# Patient Record
Sex: Male | Born: 1940 | Race: White | Hispanic: No | Smoking: Former smoker
Health system: Southern US, Community
[De-identification: ages and names within clinical notes are randomized; demographics above are authoritative.]

## PROBLEM LIST (undated history)

## (undated) DIAGNOSIS — R1110 Vomiting, unspecified: Secondary | ICD-10-CM

## (undated) DIAGNOSIS — E785 Hyperlipidemia, unspecified: Secondary | ICD-10-CM

## (undated) DIAGNOSIS — I10 Essential (primary) hypertension: Secondary | ICD-10-CM

## (undated) DIAGNOSIS — I739 Peripheral vascular disease, unspecified: Secondary | ICD-10-CM

## (undated) DIAGNOSIS — J449 Chronic obstructive pulmonary disease, unspecified: Secondary | ICD-10-CM

## (undated) HISTORY — PX: TONSILLECTOMY: SUR1361

---

## 2006-07-10 ENCOUNTER — Emergency Department: Payer: Self-pay | Admitting: Emergency Medicine

## 2007-02-27 ENCOUNTER — Ambulatory Visit: Payer: Self-pay | Admitting: Gastroenterology

## 2007-12-27 ENCOUNTER — Ambulatory Visit: Payer: Self-pay | Admitting: Family Medicine

## 2009-01-17 ENCOUNTER — Emergency Department: Payer: Self-pay | Admitting: Emergency Medicine

## 2009-07-10 ENCOUNTER — Emergency Department: Payer: Self-pay | Admitting: Emergency Medicine

## 2010-10-02 ENCOUNTER — Emergency Department: Payer: Self-pay | Admitting: Unknown Physician Specialty

## 2012-07-17 ENCOUNTER — Emergency Department: Payer: Self-pay | Admitting: Emergency Medicine

## 2013-01-21 ENCOUNTER — Ambulatory Visit: Payer: Self-pay | Admitting: Vascular Surgery

## 2013-01-21 LAB — CREATININE, SERUM: EGFR (African American): >60

## 2013-01-21 LAB — BUN: BUN: 16 mg/dL (ref 7–18)

## 2013-02-14 ENCOUNTER — Emergency Department: Payer: Self-pay | Admitting: Emergency Medicine

## 2013-03-13 ENCOUNTER — Ambulatory Visit: Payer: Self-pay | Admitting: Vascular Surgery

## 2013-03-13 LAB — CBC
HCT: 44.9 % (ref 40.0–52.0)
HGB: 15.4 g/dL (ref 13.0–18.0)
MCH: 34.5 pg — ABNORMAL HIGH (ref 26.0–34.0)
MCV: 101 fL — ABNORMAL HIGH (ref 80–100)
RBC: 4.45 10*6/uL (ref 4.40–5.90)
RDW: 13.1 % (ref 11.5–14.5)

## 2013-03-13 LAB — BASIC METABOLIC PANEL
BUN: 15 mg/dL (ref 7–18)
Calcium, Total: 8.9 mg/dL (ref 8.5–10.1)
EGFR (African American): >60
Glucose: 156 mg/dL — ABNORMAL HIGH (ref 65–99)

## 2013-03-21 ENCOUNTER — Inpatient Hospital Stay: Payer: Self-pay | Admitting: Vascular Surgery

## 2013-03-22 LAB — BASIC METABOLIC PANEL
Anion Gap: 3 — ABNORMAL LOW (ref 7–16)
BUN: 15 mg/dL (ref 7–18)
Calcium, Total: 8.4 mg/dL — ABNORMAL LOW (ref 8.5–10.1)
Chloride: 102 mmol/L (ref 98–107)
Co2: 32 mmol/L (ref 21–32)
Creatinine: 1 mg/dL (ref 0.60–1.30)
EGFR (Non-African Amer.): >60
Glucose: 99 mg/dL (ref 65–99)
Osmolality: 275 (ref 275–301)
Potassium: 3.8 mmol/L (ref 3.5–5.1)
Sodium: 137 mmol/L (ref 136–145)

## 2013-03-22 LAB — CBC WITH DIFFERENTIAL/PLATELET
Basophil #: 0.1 10*3/uL (ref 0.0–0.1)
Basophil %: 0.4 %
Eosinophil %: 3 %
HCT: 41.2 % (ref 40.0–52.0)
Lymphocyte #: 1.6 10*3/uL (ref 1.0–3.6)
MCHC: 33.9 g/dL (ref 32.0–36.0)
Monocyte #: 1.6 x10 3/mm — ABNORMAL HIGH (ref 0.2–1.0)
Monocyte %: 12.4 %
Neutrophil #: 9.4 10*3/uL — ABNORMAL HIGH (ref 1.4–6.5)
Neutrophil %: 71.7 %
Platelet: 299 10*3/uL (ref 150–440)
RBC: 4.06 10*6/uL — ABNORMAL LOW (ref 4.40–5.90)
RDW: 12.9 % (ref 11.5–14.5)
WBC: 13.1 10*3/uL — ABNORMAL HIGH (ref 3.8–10.6)

## 2014-06-17 DIAGNOSIS — I38 Endocarditis, valve unspecified: Secondary | ICD-10-CM | POA: Insufficient documentation

## 2014-06-17 DIAGNOSIS — E782 Mixed hyperlipidemia: Secondary | ICD-10-CM | POA: Insufficient documentation

## 2014-06-17 DIAGNOSIS — I4892 Unspecified atrial flutter: Secondary | ICD-10-CM | POA: Insufficient documentation

## 2015-04-09 NOTE — Op Note (Signed)
PATIENT NAME:  [name], [name] MR#:  [id number] DATE OF BIRTH:  1941/04/17  DATE OF PROCEDURE:  03/21/2013  PREOPERATIVE DIAGNOSIS: Atherosclerotic occlusive disease, bilateral lower extremities, with rest pain of the right foot.   POSTOPERATIVE DIAGNOSIS: Atherosclerotic occlusive disease, bilateral lower extremities, with rest pain of the right foot.   PROCEDURES PERFORMED:  1. Profunda femoris and common femoral endarterectomy with repair of arterial defect using xenograft CorMatrix patch.  2. Superficial femoral artery endarterectomy with repair of arterial defect using CorMatrix xenograft patch.   PROCEDURE PERFORMED BY: Katha Cabal, MD  FIRST ASSISTANT: Marin Shutter. Hanne, PA-C  ANESTHESIA: General by LMA.   FLUIDS: Per anesthesia record.   ESTIMATED BLOOD LOSS: 100 mL.   SPECIMEN: Plaque from the common femoral and profunda femoris as well as plaque from the superficial femoral artery to pathology for permanent section.   INDICATIONS: The patient is a 74 year old gentleman who presented to the office with increasing pain and worsening of his arterial occlusive disease on the right leg especially. He subsequently underwent angiography, which included intervention at the common iliac artery level. He did well from this, but has not had complete relief. At the time of angiography, it was noted that the patient had complete occlusion of the common femoral with significant heavy disease extending into the profunda femoris as well as the superficial femoral.   The risks and benefits of open endarterectomy with repair were reviewed. All questions answered. The patient has agreed to proceed with surgery.   DESCRIPTION OF PROCEDURE: The patient is taken to the operating room and placed in the supine position. After adequate general anesthesia is induced and appropriate invasive monitors are placed, he is positioned supine, and his right groin is prepped and draped in a sterile fashion.    A vertical incision is then created, and the dissection is carried down through the soft tissues to expose the common femoral artery. Dissection is then carried distally, exposing the femoral bifurcation. Superficial femoral artery is dissected for a distance of approximately 5 cm. The profunda femoris artery is dissected for a distance of approximately 2 to 3 cm, extending down to the third order branches and looping each individual profunda branch with Silastic vessel loops. The common femoral is then exposed more proximally, dissecting the external iliac artery circumferentially just above the circumflex branches. Each circumflex lateral and medial was also looped with a Silastic vessel loop. Heparin 5000 units was given. It should be noted that the vein crossing the external iliac artery just above the ligament was ligated with 2-0 silk ties. Also, the profunda vein crossing between the superficial femoral artery and the profunda femoris artery was ligated with 2-0 silk ties. Various other small collateral branches were ligated with 2-0 or 3-0 silk as they were encountered.   After 4 minutes of heparinization, the external iliac, followed by the profunda branches and the SFA were all occluded. Arteriotomy was made in the common femoral and extended first proximally and then distally into the profunda femoris, extending past the first and second major divisions. Endarterectomy was then performed under direct visualization from the level of the circumflex branches down into the profunda femoris. Posterior branch was ligated with a 6-0 Prolene. The origin of the superficial femoral artery was treated with the eversion technique at this time.   CorMatrix patch was rehydrated on the back table and then beveled to an appropriate shape and applied to repair the arterial defect using running 6-0 Prolene. Flushing maneuvers were  performed, and flow is re-established down into the profunda branches. Easily  palpable pulse was noted in all 3 major branches distal to the patch.   The superficial femoral artery was then addressed in a separate fashion. It was clamped at the very origin, occluding some of the common femoral and then more distally. Arteriotomy was made with an 11-blade and extended with Potts scissors. Endarterectomy was then performed to the superficial femoral, and a second CorMatrix patch was rehydrated on the back table, beveled and then applied to the superficial femoral artery defect using running 6-0 Prolene. Flushing maneuvers were performed, and flow is established through the superficial femoral artery.   The wound was then inspected for hemostasis. Evicel was placed along the tissue lines, and then the femoral sheath was closed with a running 2-0 Vicryl. Three more layers were reapproximated using running 3-0 Vicryl, and 4-0 Monocryl subcuticular was used to reapproximate the skin. Dermabond was applied. The patient tolerated the procedure well, and there were no immediate complications. Sponge and needle counts were correct, and he was taken to the recovery area in excellent condition.     ____________________________ Katha Cabal, MD ggs:OSi D: 03/21/2013 13:22:10 ET T: 03/21/2013 13:42:54 ET JOB#: [id number]  cc: Katha Cabal, MD, <Dictator> Katha Cabal MD ELECTRONICALLY SIGNED 04/28/2013 13:41

## 2015-04-09 NOTE — Discharge Summary (Signed)
PATIENT NAME:  [name], [name] MR#:  [id number] DATE OF BIRTH:  1941-05-02  DATE OF ADMISSION:  03/21/2013 DATE OF DISCHARGE:  03/22/2013  DISCHARGE DIAGNOSIS:  Atherosclerotic occlusive disease, bilateral lower extremities with rest pain of the left lower extremity.   PROCEDURE PERFORMED:  Right femoral endarterectomy with CorMatrix patch angioplasty.   CONSULTATIONS:  None.   HISTORY OF PRESENT ILLNESS:  The patient presented to the office with worsening rest pain and atherosclerotic changes of his lower extremity.  The risks and benefits for treatment were reviewed.  All questions answered.  The patient agrees to proceed.   HOSPITAL COURSE:  On the day of admission he underwent successful operation.  Postoperative course was unremarkable and on postoperative day #1 he was felt fit for discharge.  He is discharged to home.  He is to follow up with me in 1 week.  His medications are the same with the addition of Percocet.  He will refrain from driving until seen back in the office.   DIET:  Healthy heart as tolerated.     ____________________________ Katha Cabal, MD ggs:ea D: 03/22/2013 14:00:49 ET T: 03/23/2013 03:23:13 ET JOB#: [id number]  cc: Katha Cabal, MD, <Dictator> Katha Cabal MD ELECTRONICALLY SIGNED 04/28/2013 13:41

## 2015-04-09 NOTE — Op Note (Signed)
PATIENT NAME:  [name], [name] MR#:  [id number] DATE OF BIRTH:  [birth year]  DATE OF PROCEDURE:  01/21/2013  PREOPERATIVE DIAGNOSIS: Atherosclerotic occlusive disease of bilateral lower extremities with rest pain of right lower extremity.   POSTOPERATIVE DIAGNOSIS: Atherosclerotic occlusive disease of bilateral lower extremities with rest pain of right lower extremity.   PROCEDURES PERFORMED: 1. Abdominal aortogram.  2. Bilateral lower extremity distal runoff.  3. Introduction catheter into aorta, right common femoral artery.  4. Introduction catheter into aorta, left common femoral artery.  5. Percutaneous transluminal angioplasty and stent placement, left common iliac artery, kissing balloon technique.  6. Percutaneous transluminal angioplasty and stent placement, right common iliac artery to 9 mm, kissing balloon technique.  7. Bilateral arterial closures with ProGlide device.   SURGEON: Hortencia Pilar, MD  SEDATION: Precedex drip with IV fentanyl. Continuous ECG, pulse oximetry and cardiopulmonary monitoring was performed throughout the entire procedure by the interventional radiology nurse. Total sedation time was 1 hour, 20 minutes.   ACCESSES:  1. 6 French sheath, left common femoral artery, in retrograde direction.  2. 6 French sheath, right common femoral artery, retrograde direction.   CONTRAST USED: Isovue 110 mL.   FLUOROSCOPY TIME: 4.5 minutes.   INDICATIONS: Mr. Mcgurn is a 74 year old gentleman who was referred to the office by Dr. Mack Guise for evaluation of right lower extremity pain. Physical examination as well as noninvasive studies demonstrated profound atherosclerotic occlusive disease with rest pain and ischemia. Risks and benefits for angiography and intervention for limb salvage were reviewed, all questions answered, and the patient has agreed to proceed.   DESCRIPTION OF PROCEDURE: The patient is taken to special procedures and placed in the supine position.  After adequate sedation is achieved, both groins are prepped and draped in a sterile fashion. Ultrasound is placed in a sterile sleeve. Ultrasound is utilized secondary to lack of appropriate landmarks and to avoid vascular injury. Under direct ultrasound visualization, the left common femoral artery is identified. The femoral bifurcation is noted and the artery is scanned more proximally. A large eccentric calcified plaque is noted and the scan is carried out slightly more proximally to just above this level where the artery appears to be in relatively good condition, spared of prominent atherosclerotic changes. 1% lidocaine is infiltrated, image is recorded for the permanent record, and under direct ultrasound visualization microneedle is inserted into the anterior wall of the common femoral artery, microwire followed by microsheath, J-wire followed by a 5 French sheath and 5 French pigtail catheter. The pigtail catheter is positioned at the level of T12.   AP projection of the abdominal aorta is then obtained with bolus injection of contrast, pigtail catheter is repositioned to above the bifurcation and bilateral oblique views of the pelvis are obtained. Excessive common iliac artery disease is noted bilaterally. The image intensifier is then repositioned in the AP projection and bilateral lower extremity runoff is obtained down to the ankles with bolus injection of contrast.   After review of these images, the ultrasound is once again utilized on the right side. The  common femoral artery is identified and then scanned to above its occlusion at just the level of the circumflex vessels and because of his long-standing occlusive disease, the medial circumflex is rather large and easily identified on ultrasound. Image is recorded for the permanent record and using a micropuncture needle puncture is made at the level of the circumflex vessels, microwire followed by microsheath, J-wire followed by 6 French  sheath. Magic torque wire  is then exchanged for the J-wire and 4000 units of heparin is given. Magic torque wire is then advanced up the left side and the 5 [place] sheath exchanged for a 6 [place] sheath.   Using the markers on the Magic torque wire to estimate the length of the lesions in the common iliacs and in the RAO projection, magnified image of the aortic bifurcation is made by hand injection. An Omnilink 8 x 39 is selected for the right side and an 8 x 29 is selected for the left side. They are then advanced up and then deployed simultaneously. Deployment was to 10 atmospheres for approximately 20 seconds. Following this a pair of 9 x 4 [place] balloons are advanced up each wire and the stents are then post dilated to 9 mm, again inflations to 10 atmospheres for approximately 20 to 30 seconds.   Pigtail catheter was then introduced up the right side and bolus injection of contrast utilized to demonstrate the stents. Hand injection was used, in a retrograde fashion, up the right and left sheaths to ensure that the external iliacs were also well treated or rather free of hemodynamically significant stenoses. After review of these images, oblique views of the groin are obtained and ProGlide devices are deployed without difficulty. Light pressure is held and there are no immediate complications.   INTERPRETATION: Initial views of the abdominal aorta demonstrate that it is heavily calcified and easily visualized without contrast under fluoroscopy. However, within the aorta itself, there do not appear to be any hemodynamically significant lesions. Within the common iliac arteries bilaterally there are greater than 80% to 90% stenoses, predominantly eccentric plaque formation best visualized in the individual oblique views. The iliac bifurcations appear to be patent bilaterally with fairly good collaterals via the internal. On the left, the external iliac artery is widely patent, the common femoral artery  appears patent, but again there are excessive eccentric calcifications noted. Profunda femoris is patent and extensively collateralizes, at the level of the knee to at knee popliteal, SFA occludes shortly after its origin. The left distal popliteal is patent and there is single vessel runoff to the ankle via the peroneal.   The right common femoral occludes. There is reconstitution of the profunda femoris via collaterals which then fills the SFA. SFA is heavily diseased and at Hunter's canal there is hemodynamically significant eccentric lesions, but it is patent, popliteal artery is patent and there is two-vessel runoff to the ankle via the peroneal which is the dominant runoff and the anterior tibial which appears to be diseased in its distal aspect.   Following angioplasty of the common iliac arteries with placement of Omnilink balloon-expandable stents and then post dilatation of the stents to 9 mm there is wide patency of the iliac system.   SUMMARY: Successful treatment of bilateral common iliac artery occlusive disease.   Given the occlusion of the common femoral, on the right, and his rest pain on the right side this patient will need femoral endarterectomy as this is not an area that is acceptable for intervention         with stent placement and therefore he will undergo cardiac clearance and subsequent operative treatment.  ____________________________ Katha Cabal, MD ggs:sb D: 01/21/2013 10:31:32 ET T: 01/21/2013 12:53:21 ET JOB#: [id number]  cc: Katha Cabal, MD, <Dictator> Katha Cabal MD ELECTRONICALLY SIGNED 02/03/2013 23:25

## 2015-05-08 ENCOUNTER — Encounter: Payer: Self-pay | Admitting: Emergency Medicine

## 2015-05-08 ENCOUNTER — Emergency Department
Admission: EM | Admit: 2015-05-08 | Discharge: 2015-05-08 | Disposition: A | Discharge status: Home / Self Care | Payer: Medicare Other | Attending: Emergency Medicine | Admitting: Emergency Medicine

## 2015-05-08 DIAGNOSIS — Y9289 Other specified places as the place of occurrence of the external cause: Secondary | ICD-10-CM | POA: Diagnosis not present

## 2015-05-08 DIAGNOSIS — Z23 Encounter for immunization: Secondary | ICD-10-CM | POA: Diagnosis not present

## 2015-05-08 DIAGNOSIS — Z880 Allergy status to penicillin: Secondary | ICD-10-CM | POA: Insufficient documentation

## 2015-05-08 DIAGNOSIS — Y9389 Activity, other specified: Secondary | ICD-10-CM | POA: Diagnosis not present

## 2015-05-08 DIAGNOSIS — Z792 Long term (current) use of antibiotics: Secondary | ICD-10-CM | POA: Diagnosis not present

## 2015-05-08 DIAGNOSIS — Y998 Other external cause status: Secondary | ICD-10-CM | POA: Insufficient documentation

## 2015-05-08 DIAGNOSIS — X58XXXA Exposure to other specified factors, initial encounter: Secondary | ICD-10-CM | POA: Insufficient documentation

## 2015-05-08 DIAGNOSIS — S60552A Superficial foreign body of left hand, initial encounter: Secondary | ICD-10-CM | POA: Insufficient documentation

## 2015-05-08 DIAGNOSIS — I10 Essential (primary) hypertension: Secondary | ICD-10-CM | POA: Diagnosis not present

## 2015-05-08 HISTORY — DX: Essential (primary) hypertension: I10

## 2015-05-08 MED ORDER — TETANUS-DIPHTH-ACELL PERTUSSIS 5-2.5-18.5 LF-MCG/0.5 IM SUSP
0.5000 mL | Freq: Once | INTRAMUSCULAR | Status: AC
  Administered 2015-05-08: 0.5 mL via INTRAMUSCULAR

## 2015-05-08 MED ORDER — TETANUS-DIPHTH-ACELL PERTUSSIS 5-2.5-18.5 LF-MCG/0.5 IM SUSP
INTRAMUSCULAR | Status: AC
  Filled 2015-05-08: qty 0.5

## 2015-05-08 MED ORDER — LIDOCAINE HCL (PF) 1 % IJ SOLN
INTRAMUSCULAR | Status: AC
  Filled 2015-05-08: qty 5

## 2015-05-08 MED ORDER — HYDROCODONE-ACETAMINOPHEN 5-325 MG PO TABS: 1.0000 | ORAL_TABLET | ORAL | Status: DC | PRN

## 2015-05-08 MED ORDER — CLINDAMYCIN HCL 150 MG PO CAPS: ORAL_CAPSULE | ORAL | Status: DC

## 2015-05-08 MED ORDER — LIDOCAINE HCL (PF) 1 % IJ SOLN: 5.0000 mL | Freq: Once | INTRAMUSCULAR | Status: DC

## 2015-05-08 NOTE — Discharge Instructions (Signed)
° °  RETURN TO ER IF ANY SIGNS OF INFECTION SUCH AS REDNESS OR PUS.  CLEAN EVERY DAY WITH MILD SOAP AND WATER.  TAKE ANTIBIOTIC AS DIRECTED.  PAIN MEDICATION MAY CAUSE DROWSINESS AND INCREASE YOUR RISK OF FALLING

## 2015-05-08 NOTE — ED Notes (Signed)
Bleeding controlled.

## 2015-05-08 NOTE — ED Provider Notes (Signed)
[facility] Emergency Department Provider Note  ____________________________________________  Time seen: 77  I have reviewed the triage vital signs and the nursing notes.   HISTORY  Chief Complaint Foreign Body in Skin   HPI [name] is a 73 y.o. male has a fishhook in left hand.He states he also needs a tetanus shot. He rates his pain as 8 out of 10. This happened approximately 5 PM while he was fishing. His neighbor cut the barb off of his lure.  He is able move all his fingers without any difficulty. Noted of the fingers and hands makes his pain worse sitting still with no movement improves it.   Past Medical History  Diagnosis Date  . Hypertension     There are no active problems to display for this patient.   No past surgical history on file.  Current Outpatient Rx  Name  Route  Sig  Dispense  Refill  . clindamycin (CLEOCIN) 150 MG capsule      Take 2 every 6 hours for 5 days   40 capsule   0   . HYDROcodone-acetaminophen (NORCO/VICODIN) 5-325 MG per tablet   Oral   Take 1 tablet by mouth every 4 (four) hours as needed for moderate pain.   20 tablet   0     Allergies Penicillins  No family history on file.  Social History History  Substance Use Topics  . Smoking status: Not on file  . Smokeless tobacco: Not on file  . Alcohol Use: Not on file    Review of Systems Constitutional: No fever/chills Eyes: No visual changes. ENT: No sore throat. Cardiovascular: Denies chest pain. Respiratory: Denies shortness of breath. Gastrointestinal: .  No nausea, no vomiting.  Musculoskeletal: Negative for back pain. Skin: Positive for foreign body Neurological: Negative for headaches, focal weakness or numbness.  10-point ROS otherwise negative.  ____________________________________________   PHYSICAL EXAM:  VITAL SIGNS: ED Triage Vitals  Enc Vitals Group     BP 05/08/15 1737 160/79 mmHg     Pulse Rate 05/08/15 1737 95      Resp 05/08/15 1737 20     Temp 05/08/15 1737 98.6 F (37 C)     Temp Source 05/08/15 1737 Oral     SpO2 05/08/15 1737 95 %     Weight 05/08/15 1737 165 lb (74.844 kg)     Height 05/08/15 1737 5\' 8"  (1.727 m)     Head Cir --      Peak Flow --      Pain Score 05/08/15 1739 8     Pain Loc --      Pain Edu? --      Excl. in [place]? --     Constitutional: Alert and oriented. Well appearing and in no acute distress. Eyes: Conjunctivae are normal. PERRL. EOMI. Head: Atraumatic. Nose: No congestion/rhinnorhea. Neck: No stridor.  Supple Cardiovascular: Normal rate, regular rhythm. Grossly normal heart sounds.  Good peripheral circulation. Respiratory: Normal respiratory effort.  No retractions. Lungs CTAB. Gastrointestinal: . No distention. Musculoskeletal: No lower extremity tenderness nor edema.  No joint effusions. Neurologic:  Normal speech and language. No gross focal neurologic deficits are appreciated. Speech is normal. No gait instability. Skin:  Skin is warm, dry.  There is a large fish hook on the lateral aspect of his left hand. Psychiatric: Mood and affect are normal. Speech and behavior are normal.  ____________________________________________   LABS (all labs ordered are listed, but only abnormal results are displayed)  Labs Reviewed - No data to display   PROCEDURES  Procedure(s) performed: Left hand was prepped with Betadine. Area was entered filtrated around the fish hook with 1% lidocaine approximately 2 cc. Area was cleansed with saline solution along with fishhook. After several attempts to try and back out the fishhook it was decided that the barb was much bigger than originally felt. Fishhook and was advanced and small incision was made with a #11 blade the entire fishhook was pushed through area. Area was irrigated with pressure multiple times. There was no bleeding. digits distal to the fishhook, motor sensory function intact, vascular intact.  Critical Care  performed: No  ____________________________________________   INITIAL IMPRESSION / ASSESSMENT AND PLAN / ED COURSE  Pertinent labs & imaging results that were available during my care of the patient were reviewed by me and considered in my medical decision making (see chart for details).  Patient tolerated the procedure extremely well. He does have a history of allergies to penicillins but is not sure that he has ever been on cephalosporin. Patient was given a prescription for norco if needed for pain and clindamycin for infection prevention. He was told to return to the emergency room if any signs of infection ____________________________________________   FINAL CLINICAL IMPRESSION(S) / ED DIAGNOSES  Final diagnoses:  Foreign body in hand, left, initial encounter      Johnn Hai, PA-C 05/08/15 1925  Orbie Pyo, MD 05/09/15 [phone]

## 2015-06-15 ENCOUNTER — Encounter
Admission: RE | Disposition: A | Discharge status: Home / Self Care | Payer: Self-pay | Source: Ambulatory Visit | Attending: Vascular Surgery

## 2015-06-15 ENCOUNTER — Encounter: Payer: Self-pay | Admitting: *Deleted

## 2015-06-15 ENCOUNTER — Ambulatory Visit
Admission: RE | Admit: 2015-06-15 | Discharge: 2015-06-15 | Disposition: A | Discharge status: Home / Self Care | Payer: Medicare Other | Source: Ambulatory Visit | Attending: Vascular Surgery | Admitting: Vascular Surgery

## 2015-06-15 DIAGNOSIS — Z7902 Long term (current) use of antithrombotics/antiplatelets: Secondary | ICD-10-CM | POA: Insufficient documentation

## 2015-06-15 DIAGNOSIS — I10 Essential (primary) hypertension: Secondary | ICD-10-CM | POA: Diagnosis not present

## 2015-06-15 DIAGNOSIS — F17200 Nicotine dependence, unspecified, uncomplicated: Secondary | ICD-10-CM | POA: Diagnosis not present

## 2015-06-15 DIAGNOSIS — J449 Chronic obstructive pulmonary disease, unspecified: Secondary | ICD-10-CM | POA: Diagnosis not present

## 2015-06-15 DIAGNOSIS — Z79899 Other long term (current) drug therapy: Secondary | ICD-10-CM | POA: Insufficient documentation

## 2015-06-15 DIAGNOSIS — I70213 Atherosclerosis of native arteries of extremities with intermittent claudication, bilateral legs: Secondary | ICD-10-CM | POA: Insufficient documentation

## 2015-06-15 DIAGNOSIS — E785 Hyperlipidemia, unspecified: Secondary | ICD-10-CM | POA: Diagnosis not present

## 2015-06-15 DIAGNOSIS — K551 Chronic vascular disorders of intestine: Secondary | ICD-10-CM | POA: Insufficient documentation

## 2015-06-15 HISTORY — PX: PERIPHERAL VASCULAR CATHETERIZATION: SHX172C

## 2015-06-15 LAB — BUN: BUN: 19 mg/dL (ref 6–20)

## 2015-06-15 LAB — CREATININE, SERUM
CREATININE: 0.84 mg/dL (ref 0.61–1.24)
GFR calc Af Amer: >60 mL/min (ref >60)
GFR calc non Af Amer: >60 mL/min (ref >60)

## 2015-06-15 SURGERY — ABDOMINAL AORTOGRAM W/LOWER EXTREMITY
Wound class: Clean

## 2015-06-15 MED ORDER — FENTANYL CITRATE (PF) 100 MCG/2ML IJ SOLN
INTRAMUSCULAR | Status: AC
  Filled 2015-06-15: qty 2

## 2015-06-15 MED ORDER — CLOPIDOGREL BISULFATE 75 MG PO TABS
150.0000 mg | ORAL_TABLET | Freq: Once | ORAL | Status: AC
  Administered 2015-06-15: 150 mg via ORAL

## 2015-06-15 MED ORDER — ASPIRIN 81 MG PO CHEW
CHEWABLE_TABLET | ORAL | Status: AC
  Administered 2015-06-15: 81 mg
  Filled 2015-06-15: qty 1

## 2015-06-15 MED ORDER — CLOPIDOGREL BISULFATE 75 MG PO TABS
ORAL_TABLET | ORAL | Status: AC
  Filled 2015-06-15: qty 2

## 2015-06-15 MED ORDER — MIDAZOLAM HCL 2 MG/2ML IJ SOLN
INTRAMUSCULAR | Status: AC
  Filled 2015-06-15: qty 2

## 2015-06-15 MED ORDER — IOHEXOL 300 MG/ML  SOLN
INTRAMUSCULAR | Status: DC | PRN
  Administered 2015-06-15: 90 mL via INTRA_ARTERIAL

## 2015-06-15 MED ORDER — CLOPIDOGREL BISULFATE 75 MG PO TABS
ORAL_TABLET | ORAL | Status: AC
  Filled 2015-06-15: qty 1

## 2015-06-15 MED ORDER — FENTANYL CITRATE (PF) 100 MCG/2ML IJ SOLN
INTRAMUSCULAR | Status: DC | PRN
  Administered 2015-06-15 (×5): 50 ug via INTRAVENOUS

## 2015-06-15 MED ORDER — ASPIRIN EC 81 MG PO TBEC: 81.0000 mg | DELAYED_RELEASE_TABLET | Freq: Every day | ORAL | Status: DC

## 2015-06-15 MED ORDER — CLINDAMYCIN PHOSPHATE 300 MG/50ML IV SOLN: 300.0000 mg | Freq: Once | INTRAVENOUS | Status: DC

## 2015-06-15 MED ORDER — HEPARIN SODIUM (PORCINE) 1000 UNIT/ML IJ SOLN
INTRAMUSCULAR | Status: AC
  Filled 2015-06-15: qty 1

## 2015-06-15 MED ORDER — CLINDAMYCIN PHOSPHATE 300 MG/50ML IV SOLN
INTRAVENOUS | Status: AC
  Filled 2015-06-15: qty 50

## 2015-06-15 MED ORDER — MIDAZOLAM HCL 5 MG/5ML IJ SOLN
INTRAMUSCULAR | Status: AC
  Filled 2015-06-15: qty 5

## 2015-06-15 MED ORDER — CLOPIDOGREL BISULFATE 75 MG PO TABS: 75.0000 mg | ORAL_TABLET | Freq: Every day | ORAL | Status: DC

## 2015-06-15 MED ORDER — MIDAZOLAM HCL 2 MG/2ML IJ SOLN
INTRAMUSCULAR | Status: DC | PRN
  Administered 2015-06-15: 2 mg via INTRAVENOUS
  Administered 2015-06-15 (×4): 1 mg via INTRAVENOUS

## 2015-06-15 MED ORDER — HEPARIN (PORCINE) IN NACL 2-0.9 UNIT/ML-% IJ SOLN
INTRAMUSCULAR | Status: AC
  Filled 2015-06-15: qty 1000

## 2015-06-15 MED ORDER — SODIUM CHLORIDE 0.9 % IV SOLN
INTRAVENOUS | Status: DC
  Administered 2015-06-15: 08:00:00 via INTRAVENOUS

## 2015-06-15 MED ORDER — CLOPIDOGREL BISULFATE 75 MG PO TABS
150.0000 mg | ORAL_TABLET | ORAL | Status: AC
  Administered 2015-06-15: 150 mg via ORAL

## 2015-06-15 MED ORDER — LIDOCAINE HCL (PF) 1 % IJ SOLN
INTRAMUSCULAR | Status: AC
  Filled 2015-06-15: qty 10

## 2015-06-15 MED ORDER — HEPARIN SODIUM (PORCINE) 1000 UNIT/ML IJ SOLN
INTRAMUSCULAR | Status: DC | PRN
  Administered 2015-06-15: 4000 [IU] via INTRAVENOUS

## 2015-06-15 SURGICAL SUPPLY — 24 items
BALLN LUTONIX DCB 6X40X130 (BALLOONS) ×5
BALLN LUTONIX DCB 7X40X130 (BALLOONS) ×5
BALLOON LUTONIX DCB 6X40X130 (BALLOONS) ×3 IMPLANT
BALLOON LUTONIX DCB 7X40X130 (BALLOONS) ×3 IMPLANT
CATH CROSSER S6 154CM (CATHETERS) ×5 IMPLANT
CATH CXI SUPP ANG 4FR 135 (MICROCATHETER) ×3 IMPLANT
CATH CXI SUPP ANG 4FR 135CM (MICROCATHETER) ×5
CATH RIM 5FR (CATHETERS) ×5 IMPLANT
CATH ROYAL FLUSH PIG 5F 70CM (CATHETERS) ×5 IMPLANT
CATH SIDEKICK ANG TAPER 110 (CATHETERS) ×5 IMPLANT
DEVICE PRESTO INFLATION (MISCELLANEOUS) ×5 IMPLANT
DEVICE STARCLOSE SE CLOSURE (Vascular Products) ×5 IMPLANT
GLIDEWIRE ADV .035X260CM (WIRE) ×5 IMPLANT
GLIDEWIRE ANGLED SS 035X260CM (WIRE) ×5 IMPLANT
GUIDEWIRE SUPER STIFF .035X180 (WIRE) ×5 IMPLANT
KIT 5FR STIFF NT/TG (MISCELLANEOUS) ×5 IMPLANT
LIFESTENT 7X40X130 (Permanent Stent) ×5 IMPLANT
PACK ANGIOGRAPHY (CUSTOM PROCEDURE TRAY) ×5 IMPLANT
SET INTRO CAPELLA COAXIAL (SET/KITS/TRAYS/PACK) ×5 IMPLANT
SHEATH BRITE TIP 5FRX11 (SHEATH) ×5 IMPLANT
SHEATH BRITE TIP 6FRX11 (SHEATH) ×5 IMPLANT
SHEATH HIGHFLEX ANSEL 6FRX55 (SHEATH) ×5 IMPLANT
WIRE G V18X300CM (WIRE) ×5 IMPLANT
WIRE J 3MM .035X145CM (WIRE) ×5 IMPLANT

## 2015-06-15 NOTE — Discharge Instructions (Signed)

## 2015-06-15 NOTE — H&P (Signed)
[facility] History & Physical Update  The patient was interviewed and re-examined.  The patient's previous History and Physical has been reviewed and is unchanged.  There is no change in the plan of care.  Larenda Reedy, Dolores Lory, MD  06/15/2015, 8:07 AM

## 2015-06-15 NOTE — Op Note (Signed)
[facility]  Percutaneous Study/Intervention Procedural Note   Date of Surgery: 06/15/2015 Surgeon(s):Tracer Gutridge, Dolores Lory  Assistants: None Pre-operative Diagnosis: Atherosclerotic occlusive disease bilateral lower extremities with lifestyle limiting claudication Post-operative diagnosis:  Same  Procedure(s) Performed:  1.  Angiography of the abdominal aorta and iliac arteries  2.  Left lower extremity angiography third order catheter placement  3.  Crosser atherectomy left SFA unsuccessful  4.  Angioplasty of the left external iliac artery to 7 mm with a Lutonix balloon  5.  And plasty and stent placement right external iliac artery to 6 mm    Indications:  Patient presented to the office were for routine follow-up noting a significant worsening of his left lower extremity with ambulation. He is forcing lifestyle limitations. His noninvasive studies have deteriorated as well. He is therefore undergoing angiography with the hope for intervention the risks and benefits are reviewed all questions are answered patient agrees to proceed  Procedure:  Dennis Bellerose Braxtonis a 74 y.o. male who was identified and appropriate procedural time out was performed.  The patient was then placed supine on the table and prepped and draped in the usual sterile fashion.  Ultrasound was used to evaluate the right common femoral artery.  It was patent .  A digital ultrasound image was acquired.  A micro- needle was used to access the right common femoral artery under direct ultrasound guidance and a permanent image was performed. A stiffened micropuncture kit was utilized.  A 0.035  Amplatz Super Stiff wire was advanced without resistance under fluoroscopy and a 5Fr sheath was placed.    Pigtail catheter was then advanced to the level of T12 under fluoroscopic guidance area the detector was then positioned AP and abdominal aortogram was obtained. Pigtail catheter is then repositioned to above the  bifurcation and an RAO projection of the pelvis was obtained. After review these images a rim catheter and the stiff angle Glidewire used to cross the aortic bifurcation and the catheter was advanced down to the distal external iliac where and LAO projection of the left groin was obtained. Catheter was then advanced into the profunda femoris and the detector returned to the AP and distal runoff was obtained of the left lower extremity.  Findings:   Aortogram: Diffusely calcified but no evidence of hemodynamically significant stenoses are noted  Right Lower Extremity:  The previously placed common iliac artery stent is patent on the oblique projection there is a 60-70% narrowing in the mid to distal right external iliac. The right common femoral demonstrates changes consistent with previous endarterectomy and is widely patent as is the origins of the SFA and profunda femoris  Left Lower Extremity:  The previously placed left common iliac artery stent is patent. At the origin of the left external iliac artery there is a 60-70% stenosis. The origin of the left internal iliac artery there is a 70-80% stenosis. The distal external and distal internal iliac arteries are widely patent.  The left common femoral is patent and fills a rather large profunda femoris. The superficial femoral artery demonstrates a small cul-de-sac and occludes within 1 cm of its origin remains occluded down to the level of the femoral condyles. The at knee popliteal is reconstituted and appears to be patent down to the trifurcation where there is complete nonvisualization of the anterior tibial and posterior tibial arteries. Distally the tibioperoneal trunk appears to have some irregularity with proximally a 50% narrowing the peroneal is the only tibial vessel and  is otherwise patent down to the ankle  Given the above findings 4000 units of heparin was given a Ansell high flex sheath was advanced over a Super Stiff wire and positioned  just above the cul-de-sac of the SFA. Glidewire and microcatheter was then negotiated into the cul-de-sac and a Crosser 14 asked device was prepped on the table and then advanced down into the SFA. Crosser catheter did negotiated across the SFA all the way down to Hunter's canal however at Hunter's canal where there is extremely dense calcifications the final endpoint could not be crossed. In spite of multiple attempts with a Crosser different angled catheters as well as glide wires VAT wire and an advantage wire reentry into the true lumen at the level of Hunter's canal could not be achieved. Therefore after 20 minutes of fluoroscopy time it was elected to terminate this portion of the procedure.  The sheath was then pulled back into the left common iliac and magnified imaging of the origin the external iliac was obtained. A 7 x 4 Lutonix balloon was then advanced across this lesion inflated to 14 atm for 20 half minutes. Follow-up imaging demonstrated marked improvement with minimal residual stenosis.  The sheath was then removed and an 11 cm straight Pinnacle 6 French sheath was inserted and oblique views of the right external iliac were then obtained. I this showed a high-grade lesion in the 70% range just proximal to the endarterectomy. This appeared to have a friable almost mobile piece of plaque and therefore a 7 x 40 like stent was deployed across this area and postdilated with a 6 x 40 Lutonix to 12 atm for 1 full minute. Follow-up imaging demonstrated an excellent result with minimal residual stenosis.  Oblique view was then obtained and a Star close device deployed without difficulty.   SUMMARY: The patient's bilateral external iliac artery disease has been well treated as described above. The patient's left superficial femoral artery occlusions extensive basically beginning at the origin and extending down to the mid femoral condyle level. This was unable to be crossed. He has only single-vessel  tibial runoff.  In the event the patient does develop rest pain and/or tissue loss he would be a candidate for a femoral to peroneal bypass or attempts across and lesion could be made in a retrograde fashion from a popliteal approach.    Disposition: Patient was taken to the recovery room in stable condition having tolerated the procedure well.  Yusuke Beza, Dolores Lory 06/15/2015,9:51 AM

## 2015-06-16 DIAGNOSIS — I10 Essential (primary) hypertension: Secondary | ICD-10-CM | POA: Insufficient documentation

## 2015-06-22 ENCOUNTER — Encounter: Payer: Self-pay | Admitting: Vascular Surgery

## 2015-06-28 DIAGNOSIS — I340 Nonrheumatic mitral (valve) insufficiency: Secondary | ICD-10-CM | POA: Insufficient documentation

## 2016-03-06 DIAGNOSIS — Z85828 Personal history of other malignant neoplasm of skin: Secondary | ICD-10-CM | POA: Insufficient documentation

## 2016-07-14 ENCOUNTER — Encounter: Payer: Self-pay | Admitting: Emergency Medicine

## 2016-07-14 ENCOUNTER — Emergency Department
Admission: EM | Admit: 2016-07-14 | Discharge: 2016-07-14 | Disposition: A | Discharge status: Home / Self Care | Payer: Medicare Other | Attending: Student | Admitting: Student

## 2016-07-14 ENCOUNTER — Emergency Department: Payer: Medicare Other

## 2016-07-14 DIAGNOSIS — Y92009 Unspecified place in unspecified non-institutional (private) residence as the place of occurrence of the external cause: Secondary | ICD-10-CM | POA: Insufficient documentation

## 2016-07-14 DIAGNOSIS — Z792 Long term (current) use of antibiotics: Secondary | ICD-10-CM | POA: Diagnosis not present

## 2016-07-14 DIAGNOSIS — S4991XA Unspecified injury of right shoulder and upper arm, initial encounter: Secondary | ICD-10-CM | POA: Diagnosis present

## 2016-07-14 DIAGNOSIS — Y9389 Activity, other specified: Secondary | ICD-10-CM | POA: Insufficient documentation

## 2016-07-14 DIAGNOSIS — Y999 Unspecified external cause status: Secondary | ICD-10-CM | POA: Diagnosis not present

## 2016-07-14 DIAGNOSIS — S42201A Unspecified fracture of upper end of right humerus, initial encounter for closed fracture: Secondary | ICD-10-CM | POA: Diagnosis not present

## 2016-07-14 DIAGNOSIS — W19XXXA Unspecified fall, initial encounter: Secondary | ICD-10-CM

## 2016-07-14 DIAGNOSIS — W109XXA Fall (on) (from) unspecified stairs and steps, initial encounter: Secondary | ICD-10-CM | POA: Insufficient documentation

## 2016-07-14 DIAGNOSIS — Z79899 Other long term (current) drug therapy: Secondary | ICD-10-CM | POA: Insufficient documentation

## 2016-07-14 DIAGNOSIS — Z87891 Personal history of nicotine dependence: Secondary | ICD-10-CM | POA: Diagnosis not present

## 2016-07-14 DIAGNOSIS — I10 Essential (primary) hypertension: Secondary | ICD-10-CM | POA: Insufficient documentation

## 2016-07-14 MED ORDER — OXYCODONE HCL 5 MG PO TABS: 5.0000 mg | ORAL_TABLET | Freq: Four times a day (QID) | ORAL | 0 refills | Status: DC | PRN

## 2016-07-14 MED ORDER — KETOROLAC TROMETHAMINE 30 MG/ML IJ SOLN
15.0000 mg | Freq: Once | INTRAMUSCULAR | Status: AC
  Administered 2016-07-14: 15 mg via INTRAMUSCULAR
  Filled 2016-07-14: qty 1

## 2016-07-14 NOTE — ED Provider Notes (Signed)
[facility] Emergency Department Provider Note   ____________________________________________   First MD Initiated Contact with Patient 07/14/16 [phone]     (approximate)  I have reviewed the triage vital signs and the nursing notes.   HISTORY  Chief Complaint Fall and Shoulder Injury    HPI [name] is a 75 y.o. male with history of hypertension, not chronically anticoagulated who presents for evaluation of traumatic right shoulder pain that began after mechanical fall yesterday at 4:30 PM, sudden onset, severe, worse with movement. Patient reports that he was carrying a gallon of milk up his steps when he missed a step and fell onto the right shoulder. No head injury or loss of consciousness.He denies any other injury or pain complain as a result of the fall. He is otherwise been in his usual state of health without illness. Denies chest pain or difficulty breathing, abdominal pain, vomiting, diarrhea, fevers or chills.   Past Medical History:  Diagnosis Date  . Hypertension     There are no active problems to display for this patient.   Past Surgical History:  Procedure Laterality Date  . PERIPHERAL VASCULAR CATHETERIZATION N/A 06/15/2015   Procedure: Abdominal Aortogram w/Lower Extremity;  Surgeon: Katha Cabal, MD;  Location: [place] [facility];  Service: Cardiovascular;  Laterality: N/A;  . PERIPHERAL VASCULAR CATHETERIZATION  06/15/2015   Procedure: Lower Extremity Intervention;  Surgeon: Katha Cabal, MD;  Location: [place] [facility];  Service: Cardiovascular;;    Prior to Admission medications   Medication Sig Start Date End Date Taking? Authorizing Provider  atenolol (TENORMIN) 25 MG tablet Take 25 mg by mouth daily.    Historical Provider, MD  clindamycin (CLEOCIN) 150 MG capsule Take 2 every 6 hours for 5 days Patient not taking: Reported on 06/15/2015 05/08/15   Johnn Hai, PA-C  diltiazem (CARDIZEM) 120 MG tablet  Take 120 mg by mouth 4 (four) times daily.    Historical Provider, MD  HYDROcodone-acetaminophen (NORCO/VICODIN) 5-325 MG per tablet Take 1 tablet by mouth every 4 (four) hours as needed for moderate pain. Patient not taking: Reported on 06/15/2015 05/08/15   Johnn Hai, PA-C    Allergies Penicillins  History reviewed. No pertinent family history.  Social History Social History  Substance Use Topics  . Smoking status: Former Smoker    Types: E-cigarettes  . Smokeless tobacco: Current User  . Alcohol use 1.2 oz/week    2 Cans of beer per week    Review of Systems Constitutional: No fever/chills Eyes: No visual changes. ENT: No sore throat. Cardiovascular: Denies chest pain. Respiratory: Denies shortness of breath. Gastrointestinal: No abdominal pain.  No nausea, no vomiting.  No diarrhea.  No constipation. Genitourinary: Negative for dysuria. Musculoskeletal: Negative for back pain. Skin: Negative for rash. Neurological: Negative for headaches, focal weakness or numbness.  10-point ROS otherwise negative.  ____________________________________________   PHYSICAL EXAM:  VITAL SIGNS: ED Triage Vitals [07/14/16 0921]  Enc Vitals Group     BP (!) 150/83     Pulse Rate 88     Resp 20     Temp 98.2 F (36.8 C)     Temp Source Oral     SpO2 95 %     Weight 149 lb 4 oz (67.7 kg)     Height 5\' 7"  (1.702 m)     Head Circumference      Peak Flow      Pain Score 10  Pain Loc      Pain Edu?      Excl. in [place]?     Constitutional: Alert and oriented. Well appearing and in no acute distress. Eyes: Conjunctivae are normal. PERRL. EOMI. Head: Atraumatic. Nose: No congestion/rhinnorhea. Mouth/Throat: Mucous membranes are moist.  Oropharynx non-erythematous. Neck: No stridor. No midline C-spine tenderness to palpation. Cardiovascular: Normal rate, regular rhythm. Grossly normal heart sounds.  Good peripheral circulation. Respiratory: Normal respiratory effort.  No  retractions. Lungs CTAB. Gastrointestinal: Soft and nontender. No distention.  No CVA tenderness. Genitourinary: Deferred Musculoskeletal: No lower extremity tenderness nor edema. Pelvis stable to rock and compression, no midline T or L-spine tenderness to palpation. Significant swelling with ecchymosis and hematoma throughout the right shoulder and the proximal right forearm. Full range of motion at the right elbow. 2+ right radial pulse, right radial, median and ulnar nerve are intact. Neurologic:  Normal speech and language. No gross focal neurologic deficits are appreciated. No gait instability. Skin:  Skin is warm, dry and intact. No rash noted. Psychiatric: Mood and affect are normal. Speech and behavior are normal.  ____________________________________________   LABS (all labs ordered are listed, but only abnormal results are displayed)  Labs Reviewed - No data to display ____________________________________________  EKG  none ____________________________________________  RADIOLOGY  Xray right shoulder IMPRESSION: Comminuted fracture proximal humeral metaphysis with impaction and varus angulation at the fracture site. Multiple small fracture fragments are noted in this area. No dislocation.  ____________________________________________   PROCEDURES  Procedure(s) performed: None  Procedures  Critical Care performed: No  ____________________________________________   INITIAL IMPRESSION / ASSESSMENT AND PLAN / ED COURSE  Pertinent labs & imaging results that were available during my care of the patient were reviewed by me and considered in my medical decision making (see chart for details).  [name] is a 75 y.o. male with history of hypertension, not chronically anticoagulated who presents for evaluation of traumatic right shoulder pain that began after mechanical fall yesterday at 4:30 PM. On exam, he is generally well-appearing, intermittently in distress  secondary to pain with movement/examination of the right shoulder. He is neurovascular intact in the right arm but there is significant swelling, hematoma as well as ecchymosis concerning for underlying fracture versus fracture dislocation. We'll try to treat his pain with nonnarcotics right now as he wishes to drive home today and does not have an alternate mode of transportation.  ----------------------------------------- 11:23 AM on 07/14/2016 ----------------------------------------- Plain films show comminuted proximal humerus fracture. I discussed this with Dr. Marry Guan who is reviewed the images, recommends discharge with close outpatient follow-up. He also recommends sling and swath. I discussed return precautions with the patient, need for close Ortho follow-up and he is comfortable with the discharge plan. DC home.   Clinical Course     ____________________________________________   FINAL CLINICAL IMPRESSION(S) / ED DIAGNOSES  Final diagnoses:  Proximal humerus fracture, right, closed, initial encounter  Fall, initial encounter      NEW MEDICATIONS STARTED DURING THIS VISIT:  New Prescriptions   No medications on file     Note:  This document was prepared using Dragon voice recognition software and may include unintentional dictation errors.    Joanne Gavel, MD 07/14/16 1124

## 2016-07-14 NOTE — ED Triage Notes (Addendum)
Pt states he fell on the back steps carrying a gallon of milk and caught himself on the right side yesterday evening around 430pm. Pt reports he tripped on the steps at his house. Denies any LOC or hitting his head. Pt states it is difficult for him to raise his arms up and down. Pt states that he stopped taking plavix a month ago, no directed by MD, states he thinks it was for circulation in his legs.

## 2016-08-24 ENCOUNTER — Inpatient Hospital Stay: Payer: Medicare Other | Attending: Oncology | Admitting: Oncology

## 2016-08-24 ENCOUNTER — Encounter: Payer: Self-pay | Admitting: Emergency Medicine

## 2016-08-24 ENCOUNTER — Encounter (INDEPENDENT_AMBULATORY_CARE_PROVIDER_SITE_OTHER): Payer: Self-pay

## 2016-08-24 ENCOUNTER — Other Ambulatory Visit: Payer: Self-pay

## 2016-08-24 ENCOUNTER — Emergency Department: Payer: Medicare Other

## 2016-08-24 ENCOUNTER — Encounter: Payer: Self-pay | Admitting: Oncology

## 2016-08-24 ENCOUNTER — Inpatient Hospital Stay: Payer: Medicare Other

## 2016-08-24 ENCOUNTER — Inpatient Hospital Stay
Admission: EM | Admit: 2016-08-24 | Discharge: 2016-08-27 | DRG: 357 | Disposition: A | Discharge status: Home / Self Care | Payer: Medicare Other | Attending: Vascular Surgery | Admitting: Vascular Surgery

## 2016-08-24 VITALS — BP 139/83 | HR 90 | Temp 98.2°F | Resp 18 | Wt 140.1 lb

## 2016-08-24 DIAGNOSIS — R109 Unspecified abdominal pain: Secondary | ICD-10-CM | POA: Diagnosis not present

## 2016-08-24 DIAGNOSIS — I748 Embolism and thrombosis of other arteries: Secondary | ICD-10-CM | POA: Diagnosis present

## 2016-08-24 DIAGNOSIS — K551 Chronic vascular disorders of intestine: Secondary | ICD-10-CM | POA: Diagnosis present

## 2016-08-24 DIAGNOSIS — I10 Essential (primary) hypertension: Secondary | ICD-10-CM | POA: Insufficient documentation

## 2016-08-24 DIAGNOSIS — K55069 Acute infarction of intestine, part and extent unspecified: Secondary | ICD-10-CM | POA: Diagnosis present

## 2016-08-24 DIAGNOSIS — R1084 Generalized abdominal pain: Secondary | ICD-10-CM

## 2016-08-24 DIAGNOSIS — D72829 Elevated white blood cell count, unspecified: Secondary | ICD-10-CM | POA: Insufficient documentation

## 2016-08-24 DIAGNOSIS — Z79899 Other long term (current) drug therapy: Secondary | ICD-10-CM | POA: Insufficient documentation

## 2016-08-24 DIAGNOSIS — Z720 Tobacco use: Secondary | ICD-10-CM | POA: Diagnosis not present

## 2016-08-24 DIAGNOSIS — D649 Anemia, unspecified: Secondary | ICD-10-CM | POA: Insufficient documentation

## 2016-08-24 DIAGNOSIS — D75839 Thrombocytosis, unspecified: Secondary | ICD-10-CM

## 2016-08-24 DIAGNOSIS — K559 Vascular disorder of intestine, unspecified: Secondary | ICD-10-CM | POA: Diagnosis present

## 2016-08-24 DIAGNOSIS — Z87891 Personal history of nicotine dependence: Secondary | ICD-10-CM | POA: Insufficient documentation

## 2016-08-24 DIAGNOSIS — I739 Peripheral vascular disease, unspecified: Secondary | ICD-10-CM | POA: Diagnosis present

## 2016-08-24 DIAGNOSIS — E785 Hyperlipidemia, unspecified: Secondary | ICD-10-CM | POA: Insufficient documentation

## 2016-08-24 DIAGNOSIS — I270 Primary pulmonary hypertension: Secondary | ICD-10-CM

## 2016-08-24 DIAGNOSIS — R112 Nausea with vomiting, unspecified: Secondary | ICD-10-CM

## 2016-08-24 DIAGNOSIS — D473 Essential (hemorrhagic) thrombocythemia: Secondary | ICD-10-CM

## 2016-08-24 DIAGNOSIS — I708 Atherosclerosis of other arteries: Secondary | ICD-10-CM

## 2016-08-24 HISTORY — DX: Peripheral vascular disease, unspecified: I73.9

## 2016-08-24 HISTORY — DX: Hyperlipidemia, unspecified: E78.5

## 2016-08-24 LAB — FOLATE: Folate: 28 ng/mL (ref >5.9)

## 2016-08-24 LAB — URINALYSIS COMPLETE WITH MICROSCOPIC (ARMC ONLY)
BILIRUBIN URINE: NEGATIVE
GLUCOSE, UA: NEGATIVE mg/dL
Hgb urine dipstick: NEGATIVE
Leukocytes, UA: NEGATIVE
NITRITE: NEGATIVE
PH: 5 (ref 5.0–8.0)
Protein, ur: 30 mg/dL — AB
Specific Gravity, Urine: 1.023 (ref 1.005–1.030)
Squamous Epithelial / LPF: NONE SEEN

## 2016-08-24 LAB — CBC
HCT: 39.4 % — ABNORMAL LOW (ref 40.0–52.0)
HCT: 38.1 % — ABNORMAL LOW (ref 40.0–52.0)
HEMOGLOBIN: 13.1 g/dL (ref 13.0–18.0)
Hemoglobin: 13.4 g/dL (ref 13.0–18.0)
MCH: 32.8 pg (ref 26.0–34.0)
MCH: 33.3 pg (ref 26.0–34.0)
MCHC: 34 g/dL (ref 32.0–36.0)
MCHC: 34.5 g/dL (ref 32.0–36.0)
MCV: 96.3 fL (ref 80.0–100.0)
MCV: 96.6 fL (ref 80.0–100.0)
PLATELETS: 362 10*3/uL (ref 150–440)
Platelets: 403 10*3/uL (ref 150–440)
RBC: 4.08 MIL/uL — ABNORMAL LOW (ref 4.40–5.90)
RBC: 3.95 MIL/uL — AB (ref 4.40–5.90)
RDW: 13.4 % (ref 11.5–14.5)
RDW: 13.6 % (ref 11.5–14.5)
WBC: 13 10*3/uL — AB (ref 3.8–10.6)
WBC: 14.2 10*3/uL — ABNORMAL HIGH (ref 3.8–10.6)

## 2016-08-24 LAB — IRON AND TIBC
IRON: 29 ug/dL — AB (ref 45–182)
Saturation Ratios: 9 % — ABNORMAL LOW (ref 17.9–39.5)
TIBC: 313 ug/dL (ref 250–450)
UIBC: 284 ug/dL

## 2016-08-24 LAB — DIFFERENTIAL
BASOS ABS: 0.1 10*3/uL (ref 0–0.1)
Basophils Relative: 0 %
EOS ABS: 0 10*3/uL (ref 0–0.7)
Eosinophils Relative: 0 %
LYMPHS ABS: 1.9 10*3/uL (ref 1.0–3.6)
LYMPHS PCT: 14 %
MONOS PCT: 10 %
Monocytes Absolute: 1.4 10*3/uL — ABNORMAL HIGH (ref 0.2–1.0)
NEUTROS ABS: 10.5 10*3/uL — AB (ref 1.4–6.5)
Neutrophils Relative %: 76 %

## 2016-08-24 LAB — COMPREHENSIVE METABOLIC PANEL
ALK PHOS: 90 U/L (ref 38–126)
ALT: 13 U/L — ABNORMAL LOW (ref 17–63)
ANION GAP: 11 (ref 5–15)
AST: 25 U/L (ref 15–41)
Albumin: 4 g/dL (ref 3.5–5.0)
BILIRUBIN TOTAL: 0.7 mg/dL (ref 0.3–1.2)
BUN: 18 mg/dL (ref 6–20)
CALCIUM: 9.6 mg/dL (ref 8.9–10.3)
CO2: 26 mmol/L (ref 22–32)
Chloride: 100 mmol/L — ABNORMAL LOW (ref 101–111)
Creatinine, Ser: 0.75 mg/dL (ref 0.61–1.24)
GFR calc non Af Amer: >60 mL/min (ref >60)
Glucose, Bld: 140 mg/dL — ABNORMAL HIGH (ref 65–99)
Potassium: 3.5 mmol/L (ref 3.5–5.1)
Sodium: 137 mmol/L (ref 135–145)
TOTAL PROTEIN: 7.4 g/dL (ref 6.5–8.1)

## 2016-08-24 LAB — VITAMIN B12: Vitamin B-12: 292 pg/mL (ref 180–914)

## 2016-08-24 LAB — LACTATE DEHYDROGENASE: LDH: 142 U/L (ref 98–192)

## 2016-08-24 LAB — PROTIME-INR
INR: 1.05
Prothrombin Time: 13.7 seconds (ref 11.4–15.2)

## 2016-08-24 LAB — TROPONIN I: Troponin I: <0.03 ng/mL (ref <0.03)

## 2016-08-24 LAB — LIPASE, BLOOD: Lipase: 25 U/L (ref 11–51)

## 2016-08-24 LAB — APTT: APTT: 37 s — AB (ref 24–36)

## 2016-08-24 LAB — FERRITIN: FERRITIN: 316 ng/mL (ref 24–336)

## 2016-08-24 MED ORDER — HEPARIN (PORCINE) IN NACL 100-0.45 UNIT/ML-% IJ SOLN
1250.0000 [IU]/h | INTRAMUSCULAR | Status: DC
  Administered 2016-08-24 – 2016-08-25 (×2): 1100 [IU]/h via INTRAVENOUS
  Administered 2016-08-26: 1250 [IU]/h via INTRAVENOUS
  Filled 2016-08-24 (×4): qty 250

## 2016-08-24 MED ORDER — POTASSIUM CHLORIDE IN NACL 20-0.9 MEQ/L-% IV SOLN
INTRAVENOUS | Status: DC
Start: 2016-08-24 — End: 2016-08-27
  Administered 2016-08-24 – 2016-08-26 (×4): via INTRAVENOUS
  Filled 2016-08-24 (×7): qty 1000

## 2016-08-24 MED ORDER — HYDROMORPHONE HCL 1 MG/ML IJ SOLN
1.0000 mg | Freq: Once | INTRAMUSCULAR | Status: AC
  Administered 2016-08-24: 1 mg via INTRAVENOUS
  Filled 2016-08-24: qty 1

## 2016-08-24 MED ORDER — DOCUSATE SODIUM 100 MG PO CAPS
100.0000 mg | ORAL_CAPSULE | Freq: Two times a day (BID) | ORAL | Status: DC
  Administered 2016-08-25 – 2016-08-27 (×4): 100 mg via ORAL
  Filled 2016-08-24 (×5): qty 1

## 2016-08-24 MED ORDER — DILTIAZEM HCL 30 MG PO TABS
120.0000 mg | ORAL_TABLET | Freq: Four times a day (QID) | ORAL | Status: DC
  Administered 2016-08-25: 120 mg via ORAL
  Filled 2016-08-24 (×2): qty 2
  Filled 2016-08-24: qty 4
  Filled 2016-08-24: qty 2
  Filled 2016-08-24: qty 4

## 2016-08-24 MED ORDER — IOPAMIDOL (ISOVUE-370) INJECTION 76%
100.0000 mL | Freq: Once | INTRAVENOUS | Status: AC | PRN
  Administered 2016-08-24: 100 mL via INTRAVENOUS

## 2016-08-24 MED ORDER — CLINDAMYCIN PHOSPHATE 300 MG/50ML IV SOLN
300.0000 mg | INTRAVENOUS | Status: AC
  Administered 2016-08-25: 300 mg via INTRAVENOUS
  Filled 2016-08-24: qty 50

## 2016-08-24 MED ORDER — MORPHINE SULFATE (PF) 4 MG/ML IV SOLN
4.0000 mg | Freq: Once | INTRAVENOUS | Status: AC
  Administered 2016-08-24: 4 mg via INTRAVENOUS
  Filled 2016-08-24: qty 1

## 2016-08-24 MED ORDER — ATENOLOL 25 MG PO TABS
25.0000 mg | ORAL_TABLET | Freq: Every day | ORAL | Status: DC
  Administered 2016-08-25 – 2016-08-27 (×3): 25 mg via ORAL
  Filled 2016-08-24 (×4): qty 1

## 2016-08-24 MED ORDER — ACETAMINOPHEN 650 MG RE SUPP: 650.0000 mg | Freq: Four times a day (QID) | RECTAL | Status: DC | PRN

## 2016-08-24 MED ORDER — MORPHINE SULFATE (PF) 2 MG/ML IV SOLN
2.0000 mg | INTRAVENOUS | Status: DC | PRN
  Administered 2016-08-24 – 2016-08-26 (×8): 2 mg via INTRAVENOUS
  Filled 2016-08-24 (×7): qty 1

## 2016-08-24 MED ORDER — HYDROCODONE-ACETAMINOPHEN 5-325 MG PO TABS: 1.0000 | ORAL_TABLET | ORAL | Status: DC | PRN

## 2016-08-24 MED ORDER — HEPARIN (PORCINE) IN NACL 100-0.45 UNIT/ML-% IJ SOLN
1100.0000 [IU]/h | INTRAMUSCULAR | Status: DC
  Administered 2016-08-24: 1100 [IU]/h via INTRAVENOUS
  Filled 2016-08-24: qty 250

## 2016-08-24 MED ORDER — HEPARIN BOLUS VIA INFUSION
3800.0000 [IU] | Freq: Once | INTRAVENOUS | Status: AC
  Administered 2016-08-24: 3800 [IU] via INTRAVENOUS
  Filled 2016-08-24: qty 3800

## 2016-08-24 MED ORDER — ONDANSETRON HCL 4 MG/2ML IJ SOLN
4.0000 mg | Freq: Once | INTRAMUSCULAR | Status: AC
  Administered 2016-08-24: 4 mg via INTRAVENOUS
  Filled 2016-08-24: qty 2

## 2016-08-24 MED ORDER — SODIUM CHLORIDE 0.9 % IV BOLUS (SEPSIS)
500.0000 mL | Freq: Once | INTRAVENOUS | Status: AC
  Administered 2016-08-24: 500 mL via INTRAVENOUS

## 2016-08-24 MED ORDER — ASPIRIN EC 81 MG PO TBEC
81.0000 mg | DELAYED_RELEASE_TABLET | Freq: Every day | ORAL | Status: DC
  Administered 2016-08-25 – 2016-08-27 (×3): 81 mg via ORAL
  Filled 2016-08-24 (×3): qty 1

## 2016-08-24 MED ORDER — ONDANSETRON HCL 4 MG/2ML IJ SOLN: 4.0000 mg | Freq: Four times a day (QID) | INTRAMUSCULAR | Status: DC | PRN

## 2016-08-24 MED ORDER — ACETAMINOPHEN 325 MG PO TABS: 650.0000 mg | ORAL_TABLET | Freq: Four times a day (QID) | ORAL | Status: DC | PRN

## 2016-08-24 MED ORDER — ONDANSETRON HCL 4 MG PO TABS: 4.0000 mg | ORAL_TABLET | Freq: Four times a day (QID) | ORAL | Status: DC | PRN

## 2016-08-24 NOTE — ED Provider Notes (Signed)
[facility] Emergency Department Provider Note   ____________________________________________   First MD Initiated Contact with Patient 08/24/16 1700     (approximate)  I have reviewed the triage vital signs and the nursing notes.   HISTORY  Chief Complaint Abdominal Pain    HPI [name] is a 75 y.o. male history of hypertension, peripheral arterial disease requiring lower extremity catheterizations and aorto bi-iliac endovascular stent graft, presents for evaluation of 3-4 days of diffuse abdominal pain and vomiting, worse after eating, gradual onset, constant, severe, no modifying factors. He denies any chest pain or difficulty breathing but reports that his massive surgeon told him that he was ever having abdominal pain "it could be a heart attack". No diarrhea, fevers or chills.   Past Medical History:  Diagnosis Date  . HLD (hyperlipidemia)   . Hypertension   . PVD (peripheral vascular disease) [facility])     Patient Active Problem List   Diagnosis Date Noted  . HTN (hypertension) 08/24/2016  . 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 PAD (peripheral artery disease) ([place]) 08/24/2016    Past Surgical History:  Procedure Laterality Date  . PERIPHERAL VASCULAR CATHETERIZATION N/A 06/15/2015   Procedure: Abdominal Aortogram w/Lower Extremity;  Surgeon: Katha Cabal, MD;  Location: [place] [facility];  Service: Cardiovascular;  Laterality: N/A;  . PERIPHERAL VASCULAR CATHETERIZATION  06/15/2015   Procedure: Lower Extremity Intervention;  Surgeon: Katha Cabal, MD;  Location: [place] [facility];  Service: Cardiovascular;;    Prior to Admission medications   Medication Sig Start Date End Date Taking? Authorizing Provider  atenolol (TENORMIN) 25 MG tablet Take 25 mg by mouth daily.   Yes Historical Provider, MD  diltiazem (CARDIZEM) 120 MG tablet Take 120 mg by mouth 4  (four) times daily.   Yes Historical Provider, MD    Allergies Oxycodone-acetaminophen and Penicillins  Family History  Problem Relation Age of Onset  . Heart attack Mother   . Heart attack Father   . Basal cell carcinoma Father     Social History Social History  Substance Use Topics  . Smoking status: Former Smoker    Types: E-cigarettes  . Smokeless tobacco: Current User  . Alcohol use 1.2 oz/week    2 Cans of beer per week    Review of Systems Constitutional: No fever/chills Eyes: No visual changes. ENT: No sore throat. Cardiovascular: Denies chest pain. Respiratory: Denies shortness of breath. Gastrointestinal: + abdominal pain.  + nausea, + vomiting.  No diarrhea.  No constipation. Genitourinary: Negative for dysuria. Musculoskeletal: Negative for back pain. Skin: Negative for rash. Neurological: Negative for headaches, focal weakness or numbness.  10-point ROS otherwise negative.  ____________________________________________   PHYSICAL EXAM:  VITAL SIGNS: ED Triage Vitals  Enc Vitals Group     BP 08/24/16 1538 138/64     Pulse Rate 08/24/16 1538 72     Resp 08/24/16 1538 18     Temp 08/24/16 1538 97.5 F (36.4 C)     Temp Source 08/24/16 1538 Oral     SpO2 08/24/16 1538 95 %     Weight 08/24/16 1538 140 lb (63.5 kg)     Height 08/24/16 1538 5\' 6"  (1.676 m)     Head Circumference --      Peak Flow --      Pain Score 08/24/16 1539 10     Pain Loc --      Pain Edu? --  Excl. in [place]? --     Constitutional: Alert and oriented. Well appearing and in no acute distress. Eyes: Conjunctivae are normal. PERRL. EOMI. Head: Atraumatic. Nose: No congestion/rhinnorhea. Mouth/Throat: Mucous membranes are moist.  Oropharynx non-erythematous. Neck: No stridor.   Cardiovascular: Normal rate, regular rhythm. Grossly normal heart sounds.  Good peripheral circulation. Respiratory: Normal respiratory effort.  No retractions. Lungs CTAB. Gastrointestinal: Soft  mild tenderness, no distention. No CVA tenderness. Genitourinary: Deferred Musculoskeletal: No lower extremity tenderness nor edema.  No joint effusions. Neurologic:  Normal speech and language. No gross focal neurologic deficits are appreciated. Skin:  Skin is warm, dry and intact. No rash noted. Psychiatric: Mood and affect are normal. Speech and behavior are normal.  ____________________________________________   LABS (all labs ordered are listed, but only abnormal results are displayed)  Labs Reviewed  COMPREHENSIVE METABOLIC PANEL - Abnormal; Notable for the following:       Result Value   Chloride 100 (*)    Glucose, Bld 140 (*)    ALT 13 (*)    All other components within normal limits  CBC - Abnormal; Notable for the following:    WBC 14.2 (*)    RBC 3.95 (*)    HCT 38.1 (*)    All other components within normal limits  URINALYSIS COMPLETEWITH MICROSCOPIC ([facility] ONLY) - Abnormal; Notable for the following:    Color, Urine AMBER (*)    APPearance CLEAR (*)    Ketones, ur TRACE (*)    Protein, ur 30 (*)    Bacteria, UA RARE (*)    All other components within normal limits  DIFFERENTIAL - Abnormal; Notable for the following:    Neutro Abs 10.5 (*)    Monocytes Absolute 1.4 (*)    All other components within normal limits  APTT - Abnormal; Notable for the following:    aPTT 37 (*)    All other components within normal limits  LIPASE, BLOOD  TROPONIN I  PROTIME-INR  BASIC METABOLIC PANEL  HEPARIN LEVEL (UNFRACTIONATED)   ____________________________________________  EKG  ED ECG REPORT I, Joanne Gavel, the attending physician, personally viewed and interpreted this ECG.   Date: 08/24/2016  EKG Time: 15:36  Rate: 69  Rhythm: normal sinus rhythm  Axis: normal  Intervals:right bundle branch block  ST&T Change: No acute ST elevation or acute ST depression.  ____________________________________________  RADIOLOGY  CT abdomen and pelvis IMPRESSION:    VASCULAR    No CT evidence of aortic dissection or aneurysm.    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    Complete occlusion of the left SFA.    No portal venous gas.    NON-VASCULAR    No evidence of bowel obstruction or active inflammation. No  pneumatosis.    Colonic diverticulosis without active inflammatory changes.      ____________________________________________   PROCEDURES  Procedure(s) performed: None  Procedures  Critical Care performed:   CRITICAL CARE Performed by: Loura Pardon A   Total critical care time: 30 minutes  Critical care time was exclusive of separately billable procedures and treating other patients.  Critical care was necessary to treat or prevent imminent or life-threatening deterioration.  Critical care was time spent personally by me on the following activities: development of treatment plan with patient and/or surrogate as well as nursing, discussions with consultants, evaluation of patient's response to treatment,  examination of patient, obtaining history from patient or surrogate, ordering and performing treatments and interventions, ordering and review of laboratory studies, ordering and review of radiographic studies, pulse oximetry and re-evaluation of patient's condition.  ____________________________________________   INITIAL IMPRESSION / ASSESSMENT AND PLAN / ED COURSE  Pertinent labs & imaging results that were available during my care of the patient were reviewed by me and considered in my medical decision making (see chart for details).  [name] is a 75 y.o. male history of hypertension, peripheral arterial disease requiring lower extremity catheterizations and aorto bi-iliac endovascular stent graft, presents for evaluation of 3-4 days of diffuse abdominal pain and  vomiting. On exam, he is nontoxic. No acute distress. His vital signs are stable he is afebrile. He has mild diffuse tenderness to the patient at the abdomen. CBC shows leukocytosis. Urinalysis not consistent with infection. Remarkable CMP and lipase. Will obtain CTA of the abdomen and pelvis given concern for mesenteric ischemia, treat his pain, reassess for disposition.  ----------------------------------------- 7:54 PM on 08/24/2016 -----------------------------------------  CTA shows celiac artery occlusion, high-grade SMA occlusion. I discussed the case with Dr. Ronalee Belts of vascular surgery. Will start continuous heparin infusion per our discussion and Dr. Ronalee Belts will admit and will likely perform an intervention tomorrow. I discussed this with the patient reports that he does not want to go to the [place] though he has benefits there. He wants to be admitted to this hospital. I discussed the case with hospitalist for admission at this time.  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    Joanne Gavel, MD 08/24/16 2115

## 2016-08-24 NOTE — ED Triage Notes (Signed)
Pt presents to ED with c/o epigastric pain that started yesterday. Pt states pain is worse after eating. Pt states he was told if he "ever started having belly pain to come to ED because it might be a heart attack". Pt states pain worse with eating, denies any N/V, states some diarrhea.

## 2016-08-24 NOTE — Progress Notes (Addendum)
ANTICOAGULATION CONSULT NOTE - Initial Consult  Pharmacy Consult for Heparin  Indication: occluded celiac artery  Allergies  Allergen Reactions  . Oxycodone-Acetaminophen Other (See Comments)  . Penicillins Rash    Patient Measurements: Height: 5\' 6"  (167.6 cm) Weight: 140 lb (63.5 kg) IBW/kg (Calculated) : 63.8 Heparin Dosing Weight: 63.5 kg   Vital Signs: Temp: 97.5 F (36.4 C) (09/07 1538) Temp Source: Oral (09/07 1846) BP: 156/68 (09/07 1846) Pulse Rate: 88 (09/07 1846)  Labs:  Recent Labs  08/24/16 1144 08/24/16 1542  HGB 13.4 13.1  HCT 39.4* 38.1*  PLT 403 362  CREATININE  --  0.75  TROPONINI  --  <0.03    Estimated Creatinine Clearance: 71.7 mL/min (by C-G formula based on SCr of 0.8 mg/dL).   Medical History: Past Medical History:  Diagnosis Date  . Hypertension     Medications:   (Not in a hospital admission)  Assessment: Pharmacy consulted to dose heparin in this 75 year old male admitted with occluded celiac artery.  CrCl = 71.7 ml/min No prior anticoag noted.   Goal of Therapy:  Heparin level 0.3-0.7 units/ml Monitor platelets by anticoagulation protocol: Yes   Plan:  Give 3800 units bolus x 1 Start heparin infusion at 1100 units/hr  Will draw baseline aptt and INR. Will draw 1st HL 8 hrs after start of drip.    9/8 AM heparin level 0.50. Continue current regimen. Recheck in 8 hours to confirm.   [name] 08/24/2016,7:54 PM

## 2016-08-24 NOTE — Progress Notes (Signed)
New evaluation for abnormal cbc. States has difficulty eating due to stomach issues. Pt states is having vascular surgery on 9/19 by Dr. Delana Meyer to correct gastric issues.

## 2016-08-25 ENCOUNTER — Encounter
Admission: EM | Disposition: A | Discharge status: Home / Self Care | Payer: Self-pay | Source: Home / Self Care | Attending: Vascular Surgery

## 2016-08-25 HISTORY — PX: PERIPHERAL VASCULAR CATHETERIZATION: SHX172C

## 2016-08-25 LAB — PROTEIN ELECTROPHORESIS, SERUM
A/G RATIO SPE: 1.3 (ref 0.7–1.7)
Albumin ELP: 3.7 g/dL (ref 2.9–4.4)
Alpha-1-Globulin: 0.3 g/dL (ref 0.0–0.4)
Alpha-2-Globulin: 0.9 g/dL (ref 0.4–1.0)
Beta Globulin: 1 g/dL (ref 0.7–1.3)
GLOBULIN, TOTAL: 2.9 g/dL (ref 2.2–3.9)
Gamma Globulin: 0.7 g/dL (ref 0.4–1.8)
Total Protein ELP: 6.6 g/dL (ref 6.0–8.5)

## 2016-08-25 LAB — BASIC METABOLIC PANEL
Anion gap: 9 (ref 5–15)
BUN: 12 mg/dL (ref 6–20)
CALCIUM: 8.8 mg/dL — AB (ref 8.9–10.3)
CO2: 27 mmol/L (ref 22–32)
Chloride: 101 mmol/L (ref 101–111)
Creatinine, Ser: 0.52 mg/dL — ABNORMAL LOW (ref 0.61–1.24)
GFR calc Af Amer: >60 mL/min (ref >60)
GLUCOSE: 109 mg/dL — AB (ref 65–99)
Potassium: 3.4 mmol/L — ABNORMAL LOW (ref 3.5–5.1)
Sodium: 137 mmol/L (ref 135–145)

## 2016-08-25 LAB — HEPARIN LEVEL (UNFRACTIONATED)
Heparin Unfractionated: 0.5 IU/mL (ref 0.30–0.70)
Heparin Unfractionated: 0.4 IU/mL (ref 0.30–0.70)

## 2016-08-25 SURGERY — ABDOMINAL AORTOGRAM
Anesthesia: Moderate Sedation

## 2016-08-25 MED ORDER — FENTANYL CITRATE (PF) 100 MCG/2ML IJ SOLN
INTRAMUSCULAR | Status: AC
  Filled 2016-08-25: qty 4

## 2016-08-25 MED ORDER — ALTEPLASE 2 MG IJ SOLR
INTRAMUSCULAR | Status: AC
  Filled 2016-08-25: qty 8

## 2016-08-25 MED ORDER — STERILE WATER FOR INJECTION IJ SOLN
INTRAMUSCULAR | Status: AC
  Filled 2016-08-25: qty 10

## 2016-08-25 MED ORDER — HEPARIN SODIUM (PORCINE) 1000 UNIT/ML IJ SOLN
INTRAMUSCULAR | Status: AC
  Filled 2016-08-25: qty 1

## 2016-08-25 MED ORDER — MORPHINE SULFATE (PF) 2 MG/ML IV SOLN
INTRAVENOUS | Status: AC
  Administered 2016-08-25: 2 mg via INTRAVENOUS
  Filled 2016-08-25: qty 1

## 2016-08-25 MED ORDER — FENTANYL CITRATE (PF) 100 MCG/2ML IJ SOLN
INTRAMUSCULAR | Status: DC | PRN
  Administered 2016-08-25: 50 ug via INTRAVENOUS
  Administered 2016-08-25 (×2): 25 ug via INTRAVENOUS

## 2016-08-25 MED ORDER — DILTIAZEM HCL ER COATED BEADS 120 MG PO CP24
120.0000 mg | ORAL_CAPSULE | Freq: Every day | ORAL | Status: DC
  Administered 2016-08-26 – 2016-08-27 (×2): 120 mg via ORAL
  Filled 2016-08-25 (×3): qty 1

## 2016-08-25 MED ORDER — ALTEPLASE 2 MG IJ SOLR
INTRAMUSCULAR | Status: DC | PRN
  Administered 2016-08-25: 8 mg

## 2016-08-25 MED ORDER — MIDAZOLAM HCL 5 MG/5ML IJ SOLN
INTRAMUSCULAR | Status: AC
  Filled 2016-08-25: qty 5

## 2016-08-25 MED ORDER — LIDOCAINE-EPINEPHRINE (PF) 1 %-1:200000 IJ SOLN
INTRAMUSCULAR | Status: AC
  Filled 2016-08-25: qty 20

## 2016-08-25 MED ORDER — MIDAZOLAM HCL 2 MG/2ML IJ SOLN
INTRAMUSCULAR | Status: DC | PRN
  Administered 2016-08-25: 1 mg via INTRAVENOUS
  Administered 2016-08-25: 2 mg via INTRAVENOUS
  Administered 2016-08-25: 1 mg via INTRAVENOUS

## 2016-08-25 MED ORDER — IOPAMIDOL (ISOVUE-300) INJECTION 61%
INTRAVENOUS | Status: DC | PRN
  Administered 2016-08-25: 30 mL via INTRA_ARTERIAL

## 2016-08-25 MED ORDER — HEPARIN SODIUM (PORCINE) 1000 UNIT/ML IJ SOLN
INTRAMUSCULAR | Status: DC | PRN
  Administered 2016-08-25: 4000 [IU] via INTRAVENOUS

## 2016-08-25 MED ORDER — HEPARIN (PORCINE) IN NACL 2-0.9 UNIT/ML-% IJ SOLN
INTRAMUSCULAR | Status: AC
  Filled 2016-08-25: qty 1000

## 2016-08-25 MED ORDER — CLINDAMYCIN PHOSPHATE 300 MG/50ML IV SOLN
INTRAVENOUS | Status: AC
  Filled 2016-08-25: qty 50

## 2016-08-25 SURGICAL SUPPLY — 15 items
BALLN LUTONIX DCB 4X60X130 (BALLOONS) ×3
BALLOON LUTONIX DCB 4X60X130 (BALLOONS) ×1 IMPLANT
CATH 5FR REUT (CATHETERS) ×3 IMPLANT
CATH PIG 70CM (CATHETERS) ×3 IMPLANT
DEVICE PRESTO INFLATION (MISCELLANEOUS) ×3 IMPLANT
DEVICE STARCLOSE SE CLOSURE (Vascular Products) ×3 IMPLANT
GLIDECATH 4FR STR (CATHETERS) ×3 IMPLANT
GLIDEWIRE ADV .035X260CM (WIRE) ×3 IMPLANT
PACK ANGIOGRAPHY (CUSTOM PROCEDURE TRAY) ×3 IMPLANT
SHEATH ANL2 6FRX45 HC (SHEATH) ×3 IMPLANT
SHEATH BRITE TIP 5FRX11 (SHEATH) ×3 IMPLANT
STENT LIFESTREAM 6X26X80 (Permanent Stent) ×3 IMPLANT
VALVE HEMO TOUHY BORST Y (VALVE) ×3 IMPLANT
WIRE J 3MM .035X145CM (WIRE) ×3 IMPLANT
WIRE MAGIC TORQUE 260C (WIRE) ×3 IMPLANT

## 2016-08-25 NOTE — H&P (Signed)
  [facility] History & Physical Update  The patient was interviewed and re-examined.  The patient's previous History and Physical has been reviewed and is unchanged.  There is no change in the plan of care. We plan to proceed with the scheduled procedure.  DEW,JASON, MD  08/25/2016, 12:05 PM

## 2016-08-25 NOTE — OR Nursing (Signed)
On arrival to floor, noted saturation of gauze dressing since transport, it was dry and intact prior to departure of spr. Pressure held 5 minutes then Dressing removed, no acitive bleeding noted. Site checked with Danae Chen RN, redresse3d with guaze and tegaderm. Heparin and iv fluid with KCL infusions restarted as ordered. Reinforced need to keep left leg straight and head on [place] while flat.

## 2016-08-25 NOTE — Op Note (Signed)
[facility] Percutaneous Study/Intervention Procedural Note   Date: 08/25/2016  Surgeon(s): DEW,JASON, MD  Assistants: none  Pre-operative Diagnosis: 1.  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 Ultrasound guidance for vascular access left femoral artery  2. Catheter placement into SMA from left femoral approach 3. Aortogram and selective angiogram of the SMA  4.  Percutaneous transluminal angioplasty of the SMA with a 4 mm diameter x 6 mm length Lutonix drug-coated angioplasty balloon  5.  Catheter directed thrombolytic therapy for treatment of SMA thrombosis with 8 mg of TPA 6. Stent to the SMA with 6 mm diameter x 26 mm length Lifestream balloon expandable stent 7. StarClose closure device left femoral artery  Contrast: 30 cc  Fluoro time: 5.6 minutes  EBL: minimal  Anesthesia: Local with moderate conscious sedation for approximately 45 minutes using 4 mg of Versed and 100 g of fentanyl  Indications: Patient is a 75 y.o. male who has symptoms consistent with mesenteric ischemia. He has had acute exacerbation of his symptoms on top of chronic mesenteric ischemia consistent with acute on chronic disease. 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 Even though the radiologist did not read this as an occlusion, this was clearly occluded on the CT scan although the scan was not of the highest quality. The patient is brought in for angiography for further evaluation and potential treatment. Risks and benefits are discussed and informed consent is  obtained  Procedure: The patient was identified and appropriate procedural time out was performed. The patient was then placed supine on the table and prepped and draped in the usual sterile fashion.Moderate conscious sedation was administered during a face to face encounter with the patient throughout the procedure with my supervision of the RN administering medicines and monitoring the patient's vital signs, pulse oximetry, telemetry and mental status throughout from the start of the procedure until the patient was taken to the recovery room. Ultrasound was used to evaluate the left common femoral artery. It was patent . A digital ultrasound image was acquired. A Seldinger needle was used to access the left common femoral artery under direct ultrasound guidance and a permanent image was performed. A 0.035 J wire was advanced without resistance and a 5Fr sheath was placed. Pigtail catheter was placed into the aorta and an AP aortogram was performed. This demonstrated a normal aorta with what appeared to be normal renal arteries but no significant flow seen in the celiac or SMA. We transitioned to the lateral projection to image the celiac and SMA. The lateral image demonstrated clear occlusions of the celiac and superior mesenteric artery with extremely dense calcification of the celiac artery and at least moderate calcification of the SMA. The patient was given 4000 units of IV heparin.We upsized to a 6 Fr Ansell sheath.  A VS 1 catheter was used to selectively cannulate the SMA. This demonstrated an occlusion of the SMA with thrombus present. Based on her symptoms and these findings, I elected to treat the SMA to try to improve the patient's clinical course. I crossed the lesion without difficulty with an advantage wire and confirm intraluminal flow in the distal SMA beyond the primary  branches with a glide catheter. I then replaced a Magic torque wire and advanced the Ansell sheath to the origin,  but would not track all the way into the SMA. I elected to perform angioplasty prior to placing TPA so that I could get the sheath into the SMA itself. I used a 4 mm diameter x 6 mm length Lutonix drug-coated angioplasty balloon and inflated the balloon to 12 atm for one minute.  Completion angiogram demonstrated high-grade residual stenosis and the suspected thrombus in the SMA, so I elected to proceed with stent placement and thrombolytic therapy. I was able to advance the sheath into the SMA and directly injected 8 mg of TPA into the thrombus within the superior mesenteric artery. This was allowed to dwell for approximately 10 minutes. I then used a 6 mm diameter x 26 mm length balloon expandable Lifestream stent to perform treatment of the SMA. I inflated the balloon to 16 atm. On completion angiogram following this, less than 20 % residual stenosis was identified and marked improvement in the thrombosis in the SMA with brisk flow into most of the main SMA vessels that would be expected distally we transitioned the AP projection. At this point, I elected to terminate the procedure. The diagnostic catheter was removed. StarClose closure device was deployed in the left femoral artery in the usual fashion with excellent hemostatic result. The patient was taken to the recovery room in stable condition having tolerated the procedure well.     Findings:Aorta and renal arteries appeared normal. Celiac with dense calcification and chronic occlusion. SMA was occluded with thrombus present consistent with acute on chronic occlusion  Disposition: Patient was taken to the recovery room in stable condition having tolerated the procedure well.  Complications: None  DEW,JASON 08/25/2016 1:51 PM

## 2016-08-25 NOTE — Progress Notes (Signed)
Notified Dr. Lucky Cowboy that patient only takes is Cardizem 120mg  once a day and not 4 times a day as ordered.  He gave me a verbal or for once a day.

## 2016-08-25 NOTE — Progress Notes (Signed)
Patient educated about his fall risk.  Even after educating the patient he still refused the bed alarm.  He said he would call out if he needed it.

## 2016-08-25 NOTE — Progress Notes (Signed)
Called Dr. Delana Meyer about patient being able to take his medications this morning.  He gave the order that it was ok to give his medications with a sip of water.

## 2016-08-25 NOTE — Progress Notes (Addendum)
ANTICOAGULATION CONSULT NOTE - follow up [place] for Heparin Drip Indication: occluded celiac artery  Allergies  Allergen Reactions  . Oxycodone-Acetaminophen Other (See Comments)  . Penicillins Rash    Patient Measurements: Height: 5\' 6"  (167.6 cm) Weight: 140 lb (63.5 kg) IBW/kg (Calculated) : 63.8 Heparin Dosing Weight: 63.5 kg   Vital Signs: Temp: 98.5 F (36.9 C) (09/08 2142) Temp Source: Oral (09/08 2142) BP: 110/55 (09/08 2142) Pulse Rate: 78 (09/08 2142)  Labs:  Recent Labs  08/24/16 1144 08/24/16 1542 08/25/16 0544 08/25/16 2252  HGB 13.4 13.1  --   --   HCT 39.4* 38.1*  --   --   PLT 403 362  --   --   APTT  --  37*  --   --   LABPROT  --  13.7  --   --   INR  --  1.05  --   --   HEPARINUNFRC  --   --  0.50 0.40  CREATININE  --  0.75 0.52*  --   TROPONINI  --  <0.03  --   --     Estimated Creatinine Clearance: 71.7 mL/min (by C-G formula based on SCr of 0.8 mg/dL).   Medical History: Past Medical History:  Diagnosis Date  . HLD (hyperlipidemia)   . Hypertension   . PVD (peripheral vascular disease) ([facility])     Medications:  Prescriptions Prior to Admission  Medication Sig Dispense Refill Last Dose  . atenolol (TENORMIN) 25 MG tablet Take 25 mg by mouth daily.   08/24/2016 at 0700  . diltiazem (CARDIZEM) 120 MG tablet Take 120 mg by mouth daily.    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  CrCl = 71.7 ml/min No prior anticoag noted.   Goal of Therapy:  Heparin level 0.3-0.7 units/ml Monitor platelets by anticoagulation protocol: Yes   Plan:  Give 3800 units bolus x 1 Start heparin infusion at 1100 units/hr  Will draw baseline aptt and INR. Will draw 1st HL 8 hrs after start of drip.   9/8 AM heparin level 0.50. Continue current regimen. Recheck in 8 hours to confirm.  9/8: Patient went to Vascular surgery and Heparin drip  stopped. Signed and Held order per Dr. Lucky Cowboy says to restart Heparin Drip at 1500. Will reschedule Heparin Level for 8 hrs from 1500 to 2300.  9/8 PM heparin level 0.40. Continue current regimen. Recheck heparin level and CBC in AM.  9/9/ AM heparin level 0.25. 1000 unit bolus and increase rate to 1250 units/hr. Recheck in 8 hours.   Tami Blass S 08/25/2016,11:47 PM

## 2016-08-25 NOTE — H&P (Signed)
[facility] Admission History & Physical  MRN : [id number]  [name] is a 75 y.o. (1941-12-17) male who presents with chief complaint of  Chief Complaint  Patient presents with  . Abdominal Pain  .  History of Present Illness: I am asked to see Mr. [name] by Dr. Edd Fabian for evaluation of abdominal pain. Patient notes she has had several days of worsening abdominal pain. It is associated with some nausea and vomiting. No blood in the emesis. He also noted that the pain is much worse after eating. At the time of his interview in the ER he noted his pain was continuous. This morning upon questioning his pain is still present but much improved. He has been maintained on heparin. His had no change in his bowel habits. He has no fever or chills.  Current Facility-Administered Medications  Medication Dose Route Frequency Provider Last Rate Last Dose  . clindamycin (CLEOCIN) 300 MG/50ML IVPB           . 0.9 % NaCl with KCl 20 mEq/ L  infusion   Intravenous Continuous Katha Cabal, MD 75 mL/hr at 08/24/16 2224    . [MAR Hold] acetaminophen (TYLENOL) tablet 650 mg  650 mg Oral Q6H PRN Katha Cabal, MD       Or  . Doug Sou Hold] acetaminophen (TYLENOL) suppository 650 mg  650 mg Rectal Q6H PRN Katha Cabal, MD      . Doug Sou Hold] aspirin EC tablet 81 mg  81 mg Oral Daily Katha Cabal, MD   81 mg at 08/25/16 1015  . [MAR Hold] atenolol (TENORMIN) tablet 25 mg  25 mg Oral Daily Katha Cabal, MD   25 mg at 08/25/16 1021  . [MAR Hold] diltiazem (CARDIZEM) tablet 120 mg  120 mg Oral QID Katha Cabal, MD   120 mg at 08/25/16 1016  . [MAR Hold] docusate sodium (COLACE) capsule 100 mg  100 mg Oral BID Katha Cabal, MD      . fentaNYL (SUBLIMAZE) injection    PRN Algernon Huxley, MD   25 mcg at 08/25/16 1323  . heparin ADULT infusion 100 units/mL (25000 units/268mL sodium chloride 0.45%)  1,100 Units/hr Intravenous Continuous Katha Cabal, MD 11 mL/hr  at 08/24/16 2115 1,100 Units/hr at 08/24/16 2115  . heparin injection    PRN Algernon Huxley, MD   4,000 Units at 08/25/16 1312  . [MAR Hold] HYDROcodone-acetaminophen (NORCO/VICODIN) 5-325 MG per tablet 1-2 tablet  1-2 tablet Oral Q4H PRN Katha Cabal, MD      . midazolam (VERSED) injection    PRN Algernon Huxley, MD   1 mg at 08/25/16 1323  . [MAR Hold] morphine 2 MG/ML injection 2 mg  2 mg Intravenous Q2H PRN Katha Cabal, MD   2 mg at 08/25/16 1108  . [MAR Hold] ondansetron (ZOFRAN) tablet 4 mg  4 mg Oral Q6H PRN Katha Cabal, MD       Or  . Doug Sou Hold] ondansetron [facility]) injection 4 mg  4 mg Intravenous Q6H PRN Katha Cabal, MD        Past Medical History:  Diagnosis Date  . HLD (hyperlipidemia)   . Hypertension   . PVD (peripheral vascular disease) ([place])     Past Surgical History:  Procedure Laterality Date  . PERIPHERAL VASCULAR CATHETERIZATION N/A 06/15/2015   Procedure: Abdominal Aortogram w/Lower Extremity;  Surgeon: Katha Cabal, MD;  Location: [facility]  INVASIVE CV LAB;  Service: Cardiovascular;  Laterality: N/A;  . PERIPHERAL VASCULAR CATHETERIZATION  06/15/2015   Procedure: Lower Extremity Intervention;  Surgeon: Katha Cabal, MD;  Location: [place] [facility];  Service: Cardiovascular;;    Social History Social History  Substance Use Topics  . Smoking status: Former Smoker    Types: E-cigarettes  . Smokeless tobacco: Current User  . Alcohol use 1.2 oz/week    2 Cans of beer per week    Family History Family History  Problem Relation Age of Onset  . Heart attack Mother   . Heart attack Father   . Basal cell carcinoma Father   No family history of bleeding clotting disorders porphyria or autoimmune disease  Allergies  Allergen Reactions  . Oxycodone-Acetaminophen Other (See Comments)  . Penicillins Rash     REVIEW OF SYSTEMS (Negative unless checked)  Constitutional: [] Weight loss  [] Fever  [] Chills Cardiac: [] Chest pain   [] Chest  pressure   [] Palpitations   [] Shortness of breath when laying flat   [] Shortness of breath at rest   [] Shortness of breath with exertion. Vascular:  [x] Pain in legs with walking   [] Pain in legs at rest   [] Pain in legs when laying flat   [] Claudication   [] Pain in feet when walking  [] Pain in feet at rest  [] Pain in feet when laying flat   [] History of DVT   [] Phlebitis   [] Swelling in legs   [] Varicose veins   [] Non-healing ulcers Pulmonary:   [] Uses home oxygen   [] Productive cough   [] Hemoptysis   [] Wheeze  [x] COPD   [] Asthma Neurologic:  [] Dizziness  [] Blackouts   [] Seizures   [] History of stroke   [] History of TIA  [] Aphasia   [] Temporary blindness   [] Dysphagia   [] Weakness or numbness in arms   [] Weakness or numbness in legs Musculoskeletal:  [] Arthritis   [] Joint swelling   [] Joint pain   [] Low back pain Hematologic:  [] Easy bruising  [] Easy bleeding   [] Hypercoagulable state   [] Anemic  [] Hepatitis Gastrointestinal:  [] Blood in stool   [] Vomiting blood  [] Gastroesophageal reflux/heartburn   [] Difficulty swallowing. Genitourinary:  [] Chronic kidney disease   [] Difficult urination  [] Frequent urination  [] Burning with urination   [] Blood in urine Skin:  [] Rashes   [] Ulcers   [] Wounds Psychological:  [] History of anxiety   []  History of major depression.  Physical Examination  Vitals:   08/24/16 2138 08/25/16 0525 08/25/16 1014 08/25/16 1114  BP: (!) 148/75 (!) 159/77 (!) 147/84 (!) 158/88  Pulse: 85 88 97 96  Resp: 20 20  20   Temp: 97.6 F (36.4 C) 98.2 F (36.8 C)  98.2 F (36.8 C)  TempSrc: Oral Oral  Oral  SpO2: 95% 91%  97%  Weight:      Height:       Body mass index is 22.6 kg/m. Gen: WD/WN, NAD Head: /AT, No temporalis wasting. Prominent temp pulse not noted. Ear/Nose/Throat: Hearing grossly intact, nares w/o erythema or drainage, oropharynx w/o Erythema/Exudate,  Eyes: PERRLA, EOMI.  Neck: Supple, no nuchal rigidity.  No bruit or JVD.  Pulmonary:  Good air movement,  clear to auscultation bilaterally, no use of accessory muscles.  Cardiac: RRR, normal S1, S2, no Murmurs, rubs or gallops. Vascular:  Vessel Right Left  Radial Palpable Palpable  Ulnar Palpable Palpable  Brachial Palpable Palpable  Carotid Palpable, without bruit Palpable, without bruit  Aorta Not palpable N/A  Femoral Palpable Palpable  Popliteal Not Palpable Not Palpable  PT Not Palpable Not Palpable  DP Not Palpable Not Palpable   Gastrointestinal: Abdomen is soft but tender to palpation no rebound, he is non-distended. No guarding/reflex.  Musculoskeletal: M/S 5/5 throughout.  Extremities without ischemic changes.  No deformity or atrophy.  Neurologic: CN 2-12 intact. Pain and light touch intact in extremities.  Symmetrical.  Speech is fluent. Motor exam as listed above. Psychiatric: Judgment intact, Mood & affect appropriate for pt's clinical situation. Dermatologic: No rashes or ulcers noted.  No cellulitis or open wounds. Lymph : No Cervical, Axillary, or Inguinal lymphadenopathy.     CBC Lab Results  Component Value Date   WBC 14.2 (H) 08/24/2016   HGB 13.1 08/24/2016   HCT 38.1 (L) 08/24/2016   MCV 96.6 08/24/2016   PLT 362 08/24/2016    BMET    Component Value Date/Time   NA 137 08/25/2016 0544   NA 137 03/22/2013 0428   K 3.4 (L) 08/25/2016 0544   K 3.8 03/22/2013 0428   CL 101 08/25/2016 0544   CL 102 03/22/2013 0428   CO2 27 08/25/2016 0544   CO2 32 03/22/2013 0428   GLUCOSE 109 (H) 08/25/2016 0544   GLUCOSE 99 03/22/2013 0428   BUN 12 08/25/2016 0544   BUN 15 03/22/2013 0428   CREATININE 0.52 (L) 08/25/2016 0544   CREATININE 1.00 03/22/2013 0428   CALCIUM 8.8 (L) 08/25/2016 0544   CALCIUM 8.4 (L) 03/22/2013 0428   GFRNONAA >60 08/25/2016 0544   GFRNONAA >60 03/22/2013 0428   GFRAA >60 08/25/2016 0544   GFRAA >60 03/22/2013 0428   Estimated Creatinine Clearance: 71.7 mL/min (by C-G formula based on SCr of 0.8 mg/dL).  COAG Lab Results   Component Value Date   INR 1.05 08/24/2016    Radiology Ct Angio Abd/pel W And/or Wo Contrast  Result Date: 08/24/2016 CLINICAL DATA:  75 year old male with abdominal pain and vomiting EXAM: CTA ABDOMEN AND PELVIS wITHOUT AND WITH CONTRAST TECHNIQUE: Multidetector CT imaging of the abdomen and pelvis was performed using the standard protocol during bolus administration of intravenous contrast. Multiplanar reconstructed images and MIPs were obtained and reviewed to evaluate the vascular anatomy. CONTRAST:  100 cc Isovue 370 COMPARISON:  None. FINDINGS: There is minimal bibasilar atelectatic changes. Right lung base linear scarring noted. There is advanced coronary vascular calcification. No intra-abdominal free air or free fluid. Small gallstone. No pericholecystic fluid. The liver, pancreas, spleen, adrenal glands, kidneys, visualized ureters appear unremarkable. There is thickened appearance of the anterior bladder wall which although may partly be related to underdistention, cystitis or an infiltrative process is not excluded. Correlation with urinalysis recommended. Cystoscopy may provide better evaluation. The prostate and seminal vesicles are grossly unremarkable. There is extensive sigmoid diverticulosis without active inflammatory changes. There is no evidence of bowel obstruction or active inflammation. No pneumatosis. Normal appendix. There is advanced aortoiliac atherosclerotic disease. An aorto bi-iliac endovascular stent graft noted which appears patent. There is no aortic aneurysm or dissection. 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 No significant flow identified within the origin of the celiac axis. There is diminished flow within the splenic artery, common hepatic artery, and left gastric artery. There is dense atherosclerotic calcification of the origin of the SMA with high-grade stenosis of  approximately 3 cm segment of the SMA. The origin of the IMA appears patent. There is advanced atherosclerotic calcification of the origins of the renal arteries. The renal  arteries however remain patent. 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 The common iliac arteries are patent. There is severe atherosclerotic calcification of the internal iliac arteries. A right external iliac artery stent is noted and appears patent. There is atherosclerotic calcification of the distal left external iliac artery. The left external iliac artery remains patent. There is atherosclerotic calcification with partial luminal narrowing of the left common femoral artery. There is complete occlusion of the visualized superficial femoral artery on the left. No portal venous gas identified. There is a retro aortic left renal vein anatomy. There is no adenopathy. There is a small fat containing umbilical hernia. There is osteopenia with degenerative changes of the spine. No acute fracture identified. 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 IMPRESSION: VASCULAR No CT evidence of aortic dissection or aneurysm. 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 Complete occlusion of the left SFA. No portal venous gas. NON-VASCULAR No evidence of bowel obstruction or active inflammation. No pneumatosis. Colonic diverticulosis without active inflammatory changes. Electronically Signed   By: Anner Crete M.D.   On: 08/24/2016 19:26     Assessment/Plan 1.  Mesenteric ischemia:  Patient's chief complaint as well as the CT angiogram support mesenteric ischemia. I do not agree with the radiology read it appears that his SMA may be occluded. I have admitted the patient and started  him on a heparin drip. I will move forward with angiography with the hope for intervention. If this is not successful surgical revascularization will be discussed. The risks and benefits of been reviewed with the patient has had this discussion with the patient in the office in the past. All his questions of been answered and he agrees to proceed with angiography and intervention initially. 2.  Peripheral vascular disease with claudication:  His lower extremity symptoms are unchanged. His claudication is stable. No interventions will be performed at this time in the lower extremities. He will continue his antiplatelet agents.  3.  Hypertension:  Patient will continue medical management; nephrology is following no changes in oral medications. 4.  Hyperlipidemia:  Continue statin     Delana Meyer Dolores Lory, MD  08/25/2016 1:24 PM

## 2016-08-25 NOTE — Progress Notes (Signed)
ANTICOAGULATION CONSULT NOTE - follow up [place] for Heparin Drip Indication: occluded celiac artery  Allergies  Allergen Reactions  . Oxycodone-Acetaminophen Other (See Comments)  . Penicillins Rash    Patient Measurements: Height: 5\' 6"  (167.6 cm) Weight: 140 lb (63.5 kg) IBW/kg (Calculated) : 63.8 Heparin Dosing Weight: 63.5 kg   Vital Signs: Temp: 98.2 F (36.8 C) (09/08 1114) Temp Source: Oral (09/08 1114) BP: 123/61 (09/08 1415) Pulse Rate: 69 (09/08 1415)  Labs:  Recent Labs  08/24/16 1144 08/24/16 1542 08/25/16 0544  HGB 13.4 13.1  --   HCT 39.4* 38.1*  --   PLT 403 362  --   APTT  --  37*  --   LABPROT  --  13.7  --   INR  --  1.05  --   HEPARINUNFRC  --   --  0.50  CREATININE  --  0.75 0.52*  TROPONINI  --  <0.03  --     Estimated Creatinine Clearance: 71.7 mL/min (by C-G formula based on SCr of 0.8 mg/dL).   Medical History: Past Medical History:  Diagnosis Date  . HLD (hyperlipidemia)   . Hypertension   . PVD (peripheral vascular disease) (HCC)     Medications:  Prescriptions Prior to Admission  Medication Sig Dispense Refill Last Dose  . atenolol (TENORMIN) 25 MG tablet Take 25 mg by mouth daily.   08/24/2016 at 0700  . diltiazem (CARDIZEM) 120 MG tablet Take 120 mg by mouth 4 (four) times daily.   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  CrCl = 71.7 ml/min No prior anticoag noted.   Goal of Therapy:  Heparin level 0.3-0.7 units/ml Monitor platelets by anticoagulation protocol: Yes   Plan:  Give 3800 units bolus x 1 Start heparin infusion at 1100 units/hr  Will draw baseline aptt and INR. Will draw 1st HL 8 hrs after start of drip.   9/8 AM heparin level 0.50. Continue current regimen. Recheck in 8 hours to confirm.  9/8: Patient went to Vascular surgery and Heparin drip stopped. Signed and Held order per Dr. Lucky Cowboy says to  restart Heparin Drip at 1500. Will reschedule Heparin Level for 8 hrs from 1500 to 2300.   [name] A 08/25/2016,2:49 PM

## 2016-08-25 NOTE — Progress Notes (Signed)
Called Dr. Lucky Cowboy to get a diet order.  He gave a verbal order for clear liquid diet

## 2016-08-26 LAB — CBC
HCT: 33.6 % — ABNORMAL LOW (ref 40.0–52.0)
HEMOGLOBIN: 11.5 g/dL — AB (ref 13.0–18.0)
MCH: 33.3 pg (ref 26.0–34.0)
MCHC: 34.4 g/dL (ref 32.0–36.0)
MCV: 97 fL (ref 80.0–100.0)
PLATELETS: 298 10*3/uL (ref 150–440)
RBC: 3.47 MIL/uL — AB (ref 4.40–5.90)
RDW: 13.5 % (ref 11.5–14.5)
WBC: 13.6 10*3/uL — AB (ref 3.8–10.6)

## 2016-08-26 LAB — HEPARIN LEVEL (UNFRACTIONATED)
HEPARIN UNFRACTIONATED: 0.25 [IU]/mL — AB (ref 0.30–0.70)
HEPARIN UNFRACTIONATED: 0.52 [IU]/mL (ref 0.30–0.70)

## 2016-08-26 MED ORDER — APIXABAN 5 MG PO TABS
5.0000 mg | ORAL_TABLET | Freq: Two times a day (BID) | ORAL | Status: DC
  Administered 2016-08-26 – 2016-08-27 (×2): 5 mg via ORAL
  Filled 2016-08-26 (×2): qty 1

## 2016-08-26 MED ORDER — HEPARIN BOLUS VIA INFUSION
1000.0000 [IU] | Freq: Once | INTRAVENOUS | Status: AC
  Administered 2016-08-26: 1000 [IU] via INTRAVENOUS
  Filled 2016-08-26: qty 1000

## 2016-08-26 MED ORDER — HEPARIN (PORCINE) IN NACL 100-0.45 UNIT/ML-% IJ SOLN
1250.0000 [IU]/h | INTRAMUSCULAR | Status: DC
  Filled 2016-08-26: qty 250

## 2016-08-26 NOTE — Progress Notes (Signed)
Eliquis given and heparin gtt stopped per MD order.

## 2016-08-26 NOTE — Progress Notes (Signed)
ANTICOAGULATION CONSULT NOTE - follow up [place] for Heparin Drip Indication: occluded celiac artery  Allergies  Allergen Reactions  . Oxycodone-Acetaminophen Other (See Comments)  . Penicillins Rash    Patient Measurements: Height: 5\' 6"  (167.6 cm) Weight: 141 lb 14.4 oz (64.4 kg) IBW/kg (Calculated) : 63.8 Heparin Dosing Weight: 63.5 kg   Vital Signs: Temp: 98.4 F (36.9 C) (09/09 2033) Temp Source: Oral (09/09 2033) BP: 112/59 (09/09 2033) Pulse Rate: 85 (09/09 2033)  Labs:  Recent Labs  08/24/16 1144 08/24/16 1542  08/25/16 0544 08/25/16 2252 08/26/16 0548 08/26/16 1458  HGB 13.4 13.1  --   --   --  11.5*  --   HCT 39.4* 38.1*  --   --   --  33.6*  --   PLT 403 362  --   --   --  298  --   APTT  --  37*  --   --   --   --   --   LABPROT  --  13.7  --   --   --   --   --   INR  --  1.05  --   --   --   --   --   HEPARINUNFRC  --   --   < > 0.50 0.40 0.25* 0.52  CREATININE  --  0.75  --  0.52*  --   --   --   TROPONINI  --  <0.03  --   --   --   --   --   < > = values in this interval not displayed.  Estimated Creatinine Clearance: 72 mL/min (by C-G formula based on SCr of 0.8 mg/dL).   Medical History: Past Medical History:  Diagnosis Date  . HLD (hyperlipidemia)   . Hypertension   . PVD (peripheral vascular disease) (HCC)     Medications:  Prescriptions Prior to Admission  Medication Sig Dispense Refill Last Dose  . atenolol (TENORMIN) 25 MG tablet Take 25 mg by mouth daily.   08/24/2016 at 0700  . diltiazem (CARDIZEM) 120 MG tablet Take 120 mg by mouth daily.    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  CrCl = 71.7 ml/min No prior anticoag noted.   Goal of Therapy:  Heparin level 0.3-0.7 units/ml Monitor platelets by anticoagulation protocol: Yes   Plan:  Give 3800 units bolus x 1 Start heparin infusion at 1100 units/hr  Will draw  baseline aptt and INR. Will draw 1st HL 8 hrs after start of drip.   9/8 AM heparin level 0.50. Continue current regimen. Recheck in 8 hours to confirm.  9/8: Patient went to Vascular surgery and Heparin drip stopped. Signed and Held order per Dr. Lucky Cowboy says to restart Heparin Drip at 1500. Will reschedule Heparin Level for 8 hrs from 1500 to 2300.  9/8 PM heparin level 0.40. Continue current regimen. Recheck heparin level and CBC in AM.  9/9/ AM heparin level 0.25. 1000 unit bolus and increase rate to 1250 units/hr. Recheck in 8 hours.  9/9 1500 HL= 0.52. Contine drip at 1250units/hr and recheck in 8 hours  9/9 22:00 heparin d/c and Eliquis started per MD. Heparin consult d/c.  Jashawna Reever S 08/26/2016,11:10 PM

## 2016-08-26 NOTE — Progress Notes (Signed)
ANTICOAGULATION CONSULT NOTE - follow up [place] for Heparin Drip Indication: occluded celiac artery  Allergies  Allergen Reactions  . Oxycodone-Acetaminophen Other (See Comments)  . Penicillins Rash    Patient Measurements: Height: 5\' 6"  (167.6 cm) Weight: 141 lb 14.4 oz (64.4 kg) IBW/kg (Calculated) : 63.8 Heparin Dosing Weight: 63.5 kg   Vital Signs: Temp: 98.2 F (36.8 C) (09/09 1414) Temp Source: Oral (09/09 1414) BP: 108/56 (09/09 1414) Pulse Rate: 80 (09/09 1414)  Labs:  Recent Labs  08/24/16 1144 08/24/16 1542  08/25/16 0544 08/25/16 2252 08/26/16 0548 08/26/16 1458  HGB 13.4 13.1  --   --   --  11.5*  --   HCT 39.4* 38.1*  --   --   --  33.6*  --   PLT 403 362  --   --   --  298  --   APTT  --  37*  --   --   --   --   --   LABPROT  --  13.7  --   --   --   --   --   INR  --  1.05  --   --   --   --   --   HEPARINUNFRC  --   --   < > 0.50 0.40 0.25* 0.52  CREATININE  --  0.75  --  0.52*  --   --   --   TROPONINI  --  <0.03  --   --   --   --   --   < > = values in this interval not displayed.  Estimated Creatinine Clearance: 72 mL/min (by C-G formula based on SCr of 0.8 mg/dL).   Medical History: Past Medical History:  Diagnosis Date  . HLD (hyperlipidemia)   . Hypertension   . PVD (peripheral vascular disease) (HCC)     Medications:  Prescriptions Prior to Admission  Medication Sig Dispense Refill Last Dose  . atenolol (TENORMIN) 25 MG tablet Take 25 mg by mouth daily.   08/24/2016 at 0700  . diltiazem (CARDIZEM) 120 MG tablet Take 120 mg by mouth daily.    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  CrCl = 71.7 ml/min No prior anticoag noted.   Goal of Therapy:  Heparin level 0.3-0.7 units/ml Monitor platelets by anticoagulation protocol: Yes   Plan:  Give 3800 units bolus x 1 Start heparin infusion at 1100 units/hr  Will draw  baseline aptt and INR. Will draw 1st HL 8 hrs after start of drip.   9/8 AM heparin level 0.50. Continue current regimen. Recheck in 8 hours to confirm.  9/8: Patient went to Vascular surgery and Heparin drip stopped. Signed and Held order per Dr. Lucky Cowboy says to restart Heparin Drip at 1500. Will reschedule Heparin Level for 8 hrs from 1500 to 2300.  9/8 PM heparin level 0.40. Continue current regimen. Recheck heparin level and CBC in AM.  9/9/ AM heparin level 0.25. 1000 unit bolus and increase rate to 1250 units/hr. Recheck in 8 hours.  9/9 1500 HL= 0.52. Contine drip at 1250units/hr and recheck in 8 hours  Flor Houdeshell D Rochele Lueck 08/26/2016,3:54 PM

## 2016-08-26 NOTE — Progress Notes (Signed)
[facility]  Daily Progress Note   Subjective  - 1 Day Post-Op  Patient looks surprisingly good today. Really not having much pain. Has tolerated full liquid diet without difficulty. Once to eat solid food. Has no fever or chills. No nausea or vomiting. No signs for bleeding from his access site.  Objective Vitals:   08/26/16 0501 08/26/16 0700 08/26/16 0955 08/26/16 1414  BP: (!) 110/57 (!) 117/58 115/61 (!) 108/56  Pulse: 88 (!) 107 99 80  Resp: 17   19  Temp: 99 F (37.2 C)   98.2 F (36.8 C)  TempSrc: Oral   Oral  SpO2: 91%   96%  Weight:      Height:        Intake/Output Summary (Last 24 hours) at 08/26/16 1646 Last data filed at 08/26/16 0859  Gross per 24 hour  Intake             2340 ml  Output              200 ml  Net             2140 ml    PULM  CTAB CV  RRR VASC  Access site clean, dry, and intact. Abdomen is soft.   Laboratory CBC    Component Value Date/Time   WBC 13.6 (H) 08/26/2016 0548   HGB 11.5 (L) 08/26/2016 0548   HGB 14.0 03/22/2013 0428   HCT 33.6 (L) 08/26/2016 0548   HCT 41.2 03/22/2013 0428   PLT 298 08/26/2016 0548   PLT 299 03/22/2013 0428    BMET    Component Value Date/Time   NA 137 08/25/2016 0544   NA 137 03/22/2013 0428   K 3.4 (L) 08/25/2016 0544   K 3.8 03/22/2013 0428   CL 101 08/25/2016 0544   CL 102 03/22/2013 0428   CO2 27 08/25/2016 0544   CO2 32 03/22/2013 0428   GLUCOSE 109 (H) 08/25/2016 0544   GLUCOSE 99 03/22/2013 0428   BUN 12 08/25/2016 0544   BUN 15 03/22/2013 0428   CREATININE 0.52 (L) 08/25/2016 0544   CREATININE 1.00 03/22/2013 0428   CALCIUM 8.8 (L) 08/25/2016 0544   CALCIUM 8.4 (L) 03/22/2013 0428   GFRNONAA >60 08/25/2016 0544   GFRNONAA >60 03/22/2013 0428   GFRAA >60 08/25/2016 0544   GFRAA >60 03/22/2013 0428    Assessment/Planning: POD #1 s/p SMA revascularization percutaneously   Doing quite well.  Advance diet to regular.  KVO IV fluid.  We will begin  oral anticoagulation and stop heparin.  If tolerates all these changes, potentially discharged tomorrow on oral anticoagulation as there were significant thrombus present in his SMA at the time of revascularization    Shivaan Tierno  08/26/2016, 4:46 PM

## 2016-08-27 DIAGNOSIS — D72829 Elevated white blood cell count, unspecified: Secondary | ICD-10-CM | POA: Insufficient documentation

## 2016-08-27 MED ORDER — APIXABAN 5 MG PO TABS: 5.0000 mg | ORAL_TABLET | Freq: Two times a day (BID) | ORAL | 3 refills | Status: DC

## 2016-08-27 MED ORDER — ASPIRIN 81 MG PO TBEC: 81.0000 mg | DELAYED_RELEASE_TABLET | Freq: Every day | ORAL | 11 refills | Status: DC

## 2016-08-27 NOTE — Progress Notes (Signed)
Educated about safety precaution but Refused bed alarm.

## 2016-08-27 NOTE — Progress Notes (Signed)
[name]  [phone] [phone]) [phone]  ID: [name] OB: 10/19/1941  MR#: [id number]  [id number]  Patient Care Team: No Pcp Per Patient as PCP - General (General Practice)  CHIEF COMPLAINT: Abnormal CBC with leukocytosis and thrombocytosis.  INTERVAL HISTORY: Patient is a 75 year old male who was noted to have a persistently abnormal CBC on routine blood work. His only complaint today is of persistent abdominal pain and nausea secondary to vascular occlusion. Patient is actively being evaluated by vascular surgery. He otherwise feels well. He has no neurological complaints. He denies any recent fevers or illnesses. He denies any chest pain or shortness of breath. He has no melena or hematochezia. He has no urinary complaints. Patient otherwise feels well and offers no further specific complaints.  REVIEW OF SYSTEMS:   Review of Systems  Constitutional: Negative.  Negative for fever, malaise/fatigue and weight loss.  Respiratory: Negative.  Negative for cough and shortness of breath.   Cardiovascular: Negative.  Negative for chest pain and leg swelling.  Gastrointestinal: Positive for abdominal pain and nausea. Negative for blood in stool and melena.  Musculoskeletal: Negative.   Neurological: Negative.  Negative for weakness.  Psychiatric/Behavioral: The patient is nervous/anxious.     As per HPI. Otherwise, a complete review of systems is negative.  PAST MEDICAL HISTORY: Past Medical History:  Diagnosis Date  . HLD (hyperlipidemia)   . Hypertension   . PVD (peripheral vascular disease) ([place])     PAST SURGICAL HISTORY: Past Surgical History:  Procedure Laterality Date  . PERIPHERAL VASCULAR CATHETERIZATION N/A 06/15/2015   Procedure: Abdominal Aortogram w/Lower Extremity;  Surgeon: Katha Cabal, MD;  Location: [facility];  Service: Cardiovascular;  Laterality: N/A;  . PERIPHERAL VASCULAR CATHETERIZATION  06/15/2015   Procedure: Lower Extremity Intervention;  Surgeon: Katha Cabal, MD;  Location: [facility];  Service: Cardiovascular;;    FAMILY HISTORY: Family History  Problem Relation Age of Onset  . Heart attack Mother   . Heart attack Father   . Basal cell carcinoma Father     ADVANCED DIRECTIVES (Y/N):  N  HEALTH MAINTENANCE: Social History  Substance Use Topics  . Smoking status: Former Smoker    Types: E-cigarettes  . Smokeless tobacco: Current User  . Alcohol use 1.2 oz/week    2 Cans of beer per week     Colonoscopy:  PAP:  Bone density:  Lipid panel:  Allergies  Allergen Reactions  . Oxycodone-Acetaminophen Other (See Comments)  . Penicillins Rash    No current facility-administered medications for this visit.    Current Outpatient Prescriptions  Medication Sig Dispense Refill  . apixaban (ELIQUIS) 5 MG TABS tablet Take 1 tablet (5 mg total) by mouth 2 (two) times daily. 60 tablet 3  . aspirin EC 81 MG EC tablet Take 1 tablet (81 mg total) by mouth daily. 30 tablet 11   Facility-Administered Medications Ordered in Other Visits  Medication Dose Route Frequency Provider Last Rate Last Dose  . 0.9 % NaCl with KCl 20 mEq/ L  infusion   Intravenous Continuous Katha Cabal, MD 75 mL/hr at 08/26/16 1904    . acetaminophen (TYLENOL) tablet 650 mg  650 mg Oral Q6H PRN Katha Cabal, MD       Or  . acetaminophen (TYLENOL) suppository 650 mg  650 mg Rectal Q6H PRN Katha Cabal, MD      . apixaban Arne Cleveland) tablet 5 mg  5 mg Oral BID Corene Cornea  Bunnie Domino, MD   5 mg at 08/27/16 0919  . aspirin EC tablet 81 mg  81 mg Oral Daily Katha Cabal, MD   81 mg at 08/27/16 0919  . atenolol (TENORMIN) tablet 25 mg  25 mg Oral Daily Katha Cabal, MD   25 mg at 08/27/16 0919  . diltiazem (CARDIZEM CD) 24 hr capsule 120 mg  120 mg Oral Daily Algernon Huxley, MD   120 mg at 08/27/16 0919  . docusate sodium (COLACE) capsule 100 mg  100 mg Oral BID Katha Cabal, MD    100 mg at 08/27/16 0919  . HYDROcodone-acetaminophen (NORCO/VICODIN) 5-325 MG per tablet 1-2 tablet  1-2 tablet Oral Q4H PRN Katha Cabal, MD      . morphine 2 MG/ML injection 2 mg  2 mg Intravenous Q2H PRN Katha Cabal, MD   2 mg at 08/26/16 0854  . ondansetron (ZOFRAN) tablet 4 mg  4 mg Oral Q6H PRN Katha Cabal, MD       Or  . ondansetron [facility]) injection 4 mg  4 mg Intravenous Q6H PRN Katha Cabal, MD        OBJECTIVE: Vitals:   08/24/16 1127  BP: 139/83  Pulse: 90  Resp: 18  Temp: 98.2 F (36.8 C)     Body mass index is 21.94 kg/m.    ECOG FS:0 - Asymptomatic  General: Well-developed, well-nourished, no acute distress. Eyes: Pink conjunctiva, anicteric sclera. HEENT: Normocephalic, moist mucous membranes, clear oropharnyx. Lungs: Clear to auscultation bilaterally. Heart: Regular rate and rhythm. No rubs, murmurs, or gallops. Abdomen: Soft, nontender, nondistended. No organomegaly noted, normoactive bowel sounds. Musculoskeletal: No edema, cyanosis, or clubbing. Neuro: Alert, answering all questions appropriately. Cranial nerves grossly intact. Skin: No rashes or petechiae noted. Psych: Normal affect. Lymphatics: No cervical, calvicular, axillary or inguinal LAD.   LAB RESULTS:  Lab Results  Component Value Date   NA 137 08/25/2016   K 3.4 (L) 08/25/2016   CL 101 08/25/2016   CO2 27 08/25/2016   GLUCOSE 109 (H) 08/25/2016   BUN 12 08/25/2016   CREATININE 0.52 (L) 08/25/2016   CALCIUM 8.8 (L) 08/25/2016   PROT 7.4 08/24/2016   ALBUMIN 4.0 08/24/2016   AST 25 08/24/2016   ALT 13 (L) 08/24/2016   ALKPHOS 90 08/24/2016   BILITOT 0.7 08/24/2016   GFRNONAA >60 08/25/2016   GFRAA >60 08/25/2016    Lab Results  Component Value Date   WBC 13.6 (H) 08/26/2016   NEUTROABS 10.5 (H) 08/24/2016   HGB 11.5 (L) 08/26/2016   HCT 33.6 (L) 08/26/2016   MCV 97.0 08/26/2016   PLT 298 08/26/2016     STUDIES: Ct Angio Abd/pel W And/or Wo  Contrast  Result Date: 08/24/2016 CLINICAL DATA:  75 year old male with abdominal pain and vomiting EXAM: CTA ABDOMEN AND PELVIS wITHOUT AND WITH CONTRAST TECHNIQUE: Multidetector CT imaging of the abdomen and pelvis was performed using the standard protocol during bolus administration of intravenous contrast. Multiplanar reconstructed images and MIPs were obtained and reviewed to evaluate the vascular anatomy. CONTRAST:  100 cc Isovue 370 COMPARISON:  None. FINDINGS: There is minimal bibasilar atelectatic changes. Right lung base linear scarring noted. There is advanced coronary vascular calcification. No intra-abdominal free air or free fluid. Small gallstone. No pericholecystic fluid. The liver, pancreas, spleen, adrenal glands, kidneys, visualized ureters appear unremarkable. There is thickened appearance of the anterior bladder wall which although may partly be related to underdistention, cystitis or an  infiltrative process is not excluded. Correlation with urinalysis recommended. Cystoscopy may provide better evaluation. The prostate and seminal vesicles are grossly unremarkable. There is extensive sigmoid diverticulosis without active inflammatory changes. There is no evidence of bowel obstruction or active inflammation. No pneumatosis. Normal appendix. There is advanced aortoiliac atherosclerotic disease. An aorto bi-iliac endovascular stent graft noted which appears patent. There is no aortic aneurysm or dissection. 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 No significant flow identified within the origin of the celiac axis. There is diminished flow within the splenic artery, common hepatic artery, and left gastric artery. There is dense atherosclerotic calcification of the origin of the SMA with high-grade stenosis of approximately 3 cm segment of the SMA. The origin of the IMA appears patent. There is  advanced atherosclerotic calcification of the origins of the renal arteries. The renal arteries however remain patent. 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 The common iliac arteries are patent. There is severe atherosclerotic calcification of the internal iliac arteries. A right external iliac artery stent is noted and appears patent. There is atherosclerotic calcification of the distal left external iliac artery. The left external iliac artery remains patent. There is atherosclerotic calcification with partial luminal narrowing of the left common femoral artery. There is complete occlusion of the visualized superficial femoral artery on the left. No portal venous gas identified. There is a retro aortic left renal vein anatomy. There is no adenopathy. There is a small fat containing umbilical hernia. There is osteopenia with degenerative changes of the spine. No acute fracture identified. 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 IMPRESSION: VASCULAR No CT evidence of aortic dissection or aneurysm. 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 Complete occlusion of the left SFA. No portal venous gas. NON-VASCULAR No evidence of bowel obstruction or active inflammation. No pneumatosis. Colonic diverticulosis without active inflammatory changes. Electronically Signed   By: Anner Crete M.D.   On: 08/24/2016 19:26    ASSESSMENT: Abnormal CBC with leukocytosis and thrombocytosis.  PLAN:    1. Leukocytosis: Likely reactive with patient's ongoing abdominal vascular issues. BCR-ABL as well as peripheral blood flow cytometry have been sent and are pending at time of dictation. The remainder of his laboratory work, including SPEP, is either  negative or within normal limits. No further intervention is needed. Return to clinic in approximately one month for further evaluation and discussion of his laboratory results. 2. Thrombocytosis: Resolved. 3. Anemia: Patient's hemoglobin is mildly decreased and he also has mildly decreased iron stores. Can consider IV Feraheme in the near future. 4. Abdominal pain: Continue to evaluation and treatment per vascular surgery.  Patient expressed understanding and was in agreement with this plan. He also understands that He can call clinic at any time with any questions, concerns, or complaints.    Lloyd Huger, MD   08/27/2016 11:28 AM

## 2016-08-27 NOTE — Progress Notes (Signed)
Alert and oriented. Vital signs stable . No signs of acute distress. Discharge instructions given. Patient verbalizes understanding. No other issues noted at this time.   

## 2016-08-27 NOTE — Discharge Summary (Signed)
[facility]    Discharge Summary    Patient ID:  [name] MRN: [id number] DOB/AGE: [birth year] 75 y.o.  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 HLD (hyperlipidemia)   . Hypertension   . 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  Able to be treated percutaneously and will be maintained on anticoagulation to improve patency long term.  Hospital Course:  [name] is a 75 y.o. male is S/P Neither Procedure(s): Abdominal Aortogram Visceral Artery Intervention Extubated: NA Physical exam: abdomen soft, AF/VSS Post-op wounds clean, dry, intact or healing well Pt. Ambulating, voiding and taking PO diet without difficulty. Pt pain controlled with PO pain meds. Labs as below Complications:none  Consults:    Significant Diagnostic Studies: CBC Lab Results  Component Value Date   WBC 13.6 (H) 08/26/2016   HGB 11.5 (L) 08/26/2016   HCT 33.6 (L) 08/26/2016   MCV 97.0 08/26/2016   PLT 298 08/26/2016    BMET    Component Value Date/Time   NA  137 08/25/2016 0544   NA 137 03/22/2013 0428   K 3.4 (L) 08/25/2016 0544   K 3.8 03/22/2013 0428   CL 101 08/25/2016 0544   CL 102 03/22/2013 0428   CO2 27 08/25/2016 0544   CO2 32 03/22/2013 0428   GLUCOSE 109 (H) 08/25/2016 0544   GLUCOSE 99 03/22/2013 0428   BUN 12 08/25/2016 0544   BUN 15 03/22/2013 0428   CREATININE 0.52 (L) 08/25/2016 0544   CREATININE 1.00 03/22/2013 0428   CALCIUM 8.8 (L) 08/25/2016 0544   CALCIUM 8.4 (L) 03/22/2013 0428   GFRNONAA >60 08/25/2016 0544   GFRNONAA >60 03/22/2013 0428   GFRAA >60 08/25/2016 0544   GFRAA >60 03/22/2013 0428   COAG Lab Results  Component Value Date   INR 1.05 08/24/2016     Disposition:  Discharge to :Home Discharge Instructions    Discharge patient    Complete by:  As directed       Medication List    TAKE these medications   apixaban 5 MG Tabs tablet Commonly known as:  ELIQUIS Take 1 tablet (5 mg total) by mouth 2 (two) times daily.   aspirin 81 MG EC tablet Take 1 tablet (81 mg total) by mouth daily.   atenolol 25 MG tablet Commonly known as:  TENORMIN Take 25 mg by mouth daily.   diltiazem 120 MG tablet Commonly known as:  CARDIZEM Take 120 mg by mouth daily.      Verbal and written Discharge instructions given to the patient. Wound care per Discharge AVS Follow-up Information    DEW,JASON, MD Follow up in 3 week(s).   Specialties:  Vascular Surgery, Radiology, Interventional Cardiology Contact information: [address]  [place] [address] [phone]           SignedLeotis Pain, MD  08/27/2016, 10:02 AM

## 2016-08-28 ENCOUNTER — Encounter: Payer: Self-pay | Admitting: Vascular Surgery

## 2016-08-28 ENCOUNTER — Other Ambulatory Visit: Payer: Self-pay | Admitting: Vascular Surgery

## 2016-08-30 LAB — COMP PANEL: LEUKEMIA/LYMPHOMA

## 2016-09-05 ENCOUNTER — Ambulatory Visit: Admit: 2016-09-05 | Payer: Medicare Other | Admitting: Vascular Surgery

## 2016-09-05 SURGERY — VISCERAL ANGIOGRAPHY
Anesthesia: Moderate Sedation

## 2016-09-19 ENCOUNTER — Encounter (INDEPENDENT_AMBULATORY_CARE_PROVIDER_SITE_OTHER): Payer: Self-pay | Admitting: Vascular Surgery

## 2016-09-19 ENCOUNTER — Ambulatory Visit (INDEPENDENT_AMBULATORY_CARE_PROVIDER_SITE_OTHER): Payer: Medicare Other | Admitting: Vascular Surgery

## 2016-09-19 VITALS — BP 128/71 | HR 88 | Ht 66.0 in | Wt 144.0 lb

## 2016-09-19 DIAGNOSIS — I739 Peripheral vascular disease, unspecified: Secondary | ICD-10-CM

## 2016-09-19 DIAGNOSIS — I10 Essential (primary) hypertension: Secondary | ICD-10-CM | POA: Diagnosis not present

## 2016-09-19 DIAGNOSIS — K559 Vascular disorder of intestine, unspecified: Secondary | ICD-10-CM | POA: Diagnosis not present

## 2016-09-19 DIAGNOSIS — E782 Mixed hyperlipidemia: Secondary | ICD-10-CM | POA: Diagnosis not present

## 2016-09-19 DIAGNOSIS — K55069 Acute infarction of intestine, part and extent unspecified: Secondary | ICD-10-CM | POA: Diagnosis not present

## 2016-09-19 DIAGNOSIS — I748 Embolism and thrombosis of other arteries: Secondary | ICD-10-CM | POA: Diagnosis not present

## 2016-09-19 MED ORDER — APIXABAN 2.5 MG PO TABS
2.5000 mg | ORAL_TABLET | Freq: Two times a day (BID) | ORAL | Status: DC
Start: 2016-09-19 — End: 2016-10-02

## 2016-09-19 NOTE — Assessment & Plan Note (Signed)
Markedly improved after SMA intervention last month. He has started gaining weight and eating better.

## 2016-09-19 NOTE — Assessment & Plan Note (Signed)
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 He has done quite well since this procedure. His access site is well-healed. He is scheduled to return for a follow-up duplex in several months. He will contact our office with any problems in the interim.

## 2016-09-19 NOTE — Assessment & Plan Note (Signed)
lipid control important in reducing the progression of atherosclerotic disease. Continue statin therapy  

## 2016-09-19 NOTE — Progress Notes (Signed)
MRN : [id number]  [name] is a 75 y.o. ([birth year]) male who presents with chief complaint of  Chief Complaint  Patient presents with  . Re-evaluation    stomach blockage  .  History of Present Illness: Patient returns today in follow up of Chronic mesenteric ischemia. Well after an extensive SMA revascularization last month. He is began eating better and has started gaining some weight back. He feels reasonably well today. He had no periprocedural complications and is without complaints today.  Current Outpatient Prescriptions  Medication Sig Dispense Refill  . aspirin EC 81 MG EC tablet Take 1 tablet (81 mg total) by mouth daily. 30 tablet 11  . atenolol (TENORMIN) 25 MG tablet Take 25 mg by mouth daily.    Marland Kitchen diltiazem (CARDIZEM) 120 MG tablet Take 120 mg by mouth daily.      No current facility-administered medications for this visit.     Past Medical History:  Diagnosis Date  . HLD (hyperlipidemia)   . Hypertension   . PVD (peripheral vascular disease) ([place])     Past Surgical History:  Procedure Laterality Date  . PERIPHERAL VASCULAR CATHETERIZATION N/A 06/15/2015   Procedure: Abdominal Aortogram w/Lower Extremity;  Surgeon: Katha Cabal, MD;  Location: [facility];  Service: Cardiovascular;  Laterality: N/A;  . PERIPHERAL VASCULAR CATHETERIZATION  06/15/2015   Procedure: Lower Extremity Intervention;  Surgeon: Katha Cabal, MD;  Location: [facility];  Service: Cardiovascular;;  . PERIPHERAL VASCULAR CATHETERIZATION N/A 08/25/2016   Procedure: Abdominal Aortogram;  Surgeon: Algernon Huxley, MD;  Location: [facility];  Service: Cardiovascular;  Laterality: N/A;  . PERIPHERAL VASCULAR CATHETERIZATION N/A 08/25/2016   Procedure: Visceral Artery Intervention;  Surgeon: Algernon Huxley, MD;  Location: [facility];  Service: Cardiovascular;  Laterality: N/A;    Social History Social History  Substance Use Topics  . Smoking status:  Former Smoker    Types: E-cigarettes  . Smokeless tobacco: Current User  . Alcohol use 1.2 oz/week    2 Cans of beer per week   No IV drug use  Family History Family History  Problem Relation Age of Onset  . Heart attack Mother   . Heart attack Father   . Basal cell carcinoma Father    No bleeding or clotting disorders  Allergies  Allergen Reactions  . Eliquis [Apixaban] Other (See Comments)    causes nose bleeds  . Oxycodone-Acetaminophen Other (See Comments)  . Penicillins Rash     REVIEW OF SYSTEMS (Negative unless checked)  Constitutional: [x] Weight loss  [] Fever  [] Chills Cardiac: [] Chest pain   [] Chest pressure   [] Palpitations   [] Shortness of breath when laying flat   [] Shortness of breath at rest   [] Shortness of breath with exertion. Vascular:  [x] Pain in legs with walking   [] Pain in legs at rest   [] Pain in legs when laying flat   [] Claudication   [] Pain in feet when walking  [] Pain in feet at rest  [] Pain in feet when laying flat   [] History of DVT   [] Phlebitis   [] Swelling in legs   [] Varicose veins   [] Non-healing ulcers Pulmonary:   [] Uses home oxygen   [x] Productive cough   [] Hemoptysis   [] Wheeze  [] COPD   [] Asthma Neurologic:  [] Dizziness  [] Blackouts   [] Seizures   [] History of stroke   [] History of TIA  [] Aphasia   [] Temporary blindness   [] Dysphagia   [] Weakness or numbness in  arms   [] Weakness or numbness in legs Musculoskeletal:  [] Arthritis   [] Joint swelling   [] Joint pain   [] Low back pain Hematologic:  [] Easy bruising  [] Easy bleeding   [] Hypercoagulable state   [] Anemic   Gastrointestinal:  [] Blood in stool   [] Vomiting blood  [] Gastroesophageal reflux/heartburn   [x] Abdominal pain Genitourinary:  [] Chronic kidney disease   [] Difficult urination  [] Frequent urination  [] Burning with urination   [] Hematuria Skin:  [] Rashes   [] Ulcers   [] Wounds Psychological:  [] History of anxiety   []  History of major depression.  Physical Examination  BP 128/71  (BP Location: Left Arm)   Pulse 88   Ht 5' 6"  (1.676 m)   Wt 144 lb (65.3 kg)   BMI 23.24 kg/m  Gen:  WD/WN, NAD Head: [place]/AT, No temporalis wasting. Ear/Nose/Throat: Hearing grossly intact, nares w/o erythema or drainage, trachea midline Eyes: PERRLA, EOMI. Sclera non-icteric Neck: Supple, no nuchal rigidity.  No JVD.  Pulmonary:  Good air movement, no use of accessory muscles.  Cardiac: RRR, normal S1, S2 Vascular:  Vessel Right Left  Radial Palpable Palpable                                   Gastrointestinal: soft, non-tender/non-distended. No guarding/reflex.  Musculoskeletal: M/S 5/5 throughout.  No deformity or atrophy. No lower extremity edema. Neurologic: CN 2-12 intact. Pain and light touch intact in extremities.  Symmetrical.  Speech is fluent.  Psychiatric: Judgment intact, Mood & affect appropriate for pt's clinical situation. Dermatologic: No rashes or ulcers noted.  No cellulitis or open wounds. Lymph : No Cervical, Axillary, or Inguinal lymphadenopathy.      Labs Recent Results (from the past 2160 hour(s))  CBC     Status: Abnormal   Collection Time: 08/24/16 11:44 AM  Result Value Ref Range   WBC 13.0 (H) 3.8 - 10.6 K/uL   RBC 4.08 (L) 4.40 - 5.90 MIL/uL   Hemoglobin 13.4 13.0 - 18.0 g/dL   HCT 39.4 (L) 40.0 - 52.0 %   MCV 96.3 80.0 - 100.0 fL   MCH 32.8 26.0 - 34.0 pg   MCHC 34.0 32.0 - 36.0 g/dL   RDW 13.4 11.5 - 14.5 %   Platelets 403 150 - 440 K/uL  Ferritin     Status: None   Collection Time: 08/24/16 11:44 AM  Result Value Ref Range   Ferritin 316 24 - 336 ng/mL  Iron and TIBC     Status: Abnormal   Collection Time: 08/24/16 11:44 AM  Result Value Ref Range   Iron 29 (L) 45 - 182 ug/dL   TIBC 313 250 - 450 ug/dL   Saturation Ratios 9 (L) 17.9 - 39.5 %   UIBC 284 ug/dL  Vitamin B12     Status: None   Collection Time: 08/24/16 11:44 AM  Result Value Ref Range   Vitamin B-12 292 180 - 914 pg/mL    Comment: (NOTE) This assay is not  validated for testing neonatal or myeloproliferative syndrome specimens for Vitamin B12 levels. Performed at [facility]   Folic Acid (Folate)     Status: None   Collection Time: 08/24/16 11:44 AM  Result Value Ref Range   Folate 28.0 >5.9 ng/mL  Protein electrophoresis, serum     Status: None   Collection Time: 08/24/16 11:44 AM  Result Value Ref Range   Total Protein ELP 6.6 6.0 - 8.5 g/dL  Albumin ELP 3.7 2.9 - 4.4 g/dL   Alpha-1-Globulin 0.3 0.0 - 0.4 g/dL   Alpha-2-Globulin 0.9 0.4 - 1.0 g/dL   Beta Globulin 1.0 0.7 - 1.3 g/dL   Gamma Globulin 0.7 0.4 - 1.8 g/dL   M-Spike, % Not Observed Not Observed g/dL   SPE Interp. Comment     Comment: (NOTE) The SPE pattern appears essentially unremarkable. Evidence of monoclonal protein is not apparent. Performed At: [facility] [place], [place] [id number] Lindon Romp MD QZ:[id number]    Comment Comment     Comment: (NOTE) Protein electrophoresis scan will follow via computer, mail, or courier delivery.    GLOBULIN, TOTAL 2.9 2.2 - 3.9 g/dL   A/G Ratio 1.3 0.7 - 1.7  Lactate dehydrogenase     Status: None   Collection Time: 08/24/16 11:44 AM  Result Value Ref Range   LDH 142 98 - 192 U/L  Flow cytometry panel-leukemia/lymphoma work-up     Status: None   Collection Time: 08/24/16 11:44 AM  Result Value Ref Range   PATH INTERP XXX-IMP Comment     Comment: No significant immunophenotypic abnormality detected   CLINICAL INFO Comment     Comment: (NOTE) Thrombocytosis Accompanying CBC dated 08-24-16 shows:WBC 13.0    Misc Source Comment     Comment: Peripheral blood   ASSESSMENT OF LEUKOCYTES Comment     Comment: (NOTE) No monoclonal B cell population is detected. kappa:lambda ratio 1.2 There is no loss of, or aberrant expression of, the pan T cell antigens to suggest a neoplastic T cell process. CD4:CD8 ratio 2.4 No circulating blasts are detected. There is no immunophenotypic   evidence of abnormal myeloid maturation. Monocytes show very dim, partial, aberrant expression of CD56, a finding that can be seen in association with both reactive/activated processes as well as neoplastic processes. Analysis of the leukocyte population shows: granulocytes 78%, monocytes 9%, lymphocytes 13%, blasts <0.1%, B cells 1%, T cells 10%, NK cells 2%.    % Viable Cells Comment     Comment: 93%   ANALYSIS AND GATING STRATEGY Comment     Comment: 8 color analysis with CD45/SSC   IMMUNOPHENOTYPING STUDY Comment     Comment: (NOTE) CD2       Normal         CD3       Normal CD4       Normal         CD5       Normal CD7       Normal         CD8       Normal CD10      Normal         CD11b     Normal CD13      Normal         CD14      Normal CD16      Normal         CD19      Normal CD20      Normal         CD33      Normal CD34      Normal         CD38      Normal CD45      Normal         CD56      Normal CD57      Normal         CD117  Normal HLA-DR    Normal         KAPPA     Normal LAMBDA    Normal         CD64      Normal    PATHOLOGIST NAME Comment     Comment: Gaston Islam, M.D.   COMMENT: Comment     Comment: (NOTE) Each antibody in this assay was utilized to assess for potential abnormalities of studied cell populations or to characterize identified abnormalities. This test was developed and its performance characteristics determined by [facility].  It has not been cleared or approved by the U.S. Food and Drug Administration. The FDA has determined that such clearance or approval is not necessary. This test is used for clinical purposes.  It should not be regarded as investigational or for research. Performed At: -[facility] [address] [place], [place] [id number] Nechama Guard MD [id number] Performed At: [facility] [address] [place], [place] [id number] Nechama Guard MD [id number]   Urinalysis complete, with  microscopic     Status: Abnormal   Collection Time: 08/24/16  3:41 PM  Result Value Ref Range   Color, Urine AMBER (A) YELLOW   APPearance CLEAR (A) CLEAR   Glucose, UA NEGATIVE NEGATIVE mg/dL   Bilirubin Urine NEGATIVE NEGATIVE   Ketones, ur TRACE (A) NEGATIVE mg/dL   Specific Gravity, Urine 1.023 1.005 - 1.030   Hgb urine dipstick NEGATIVE NEGATIVE   pH 5.0 5.0 - 8.0   Protein, ur 30 (A) NEGATIVE mg/dL   Nitrite NEGATIVE NEGATIVE   Leukocytes, UA NEGATIVE NEGATIVE   RBC / HPF 6-30 0 - 5 RBC/hpf   WBC, UA 0-5 0 - 5 WBC/hpf   Bacteria, UA RARE (A) NONE SEEN   Squamous Epithelial / LPF NONE SEEN NONE SEEN   Mucous PRESENT    Hyaline Casts, UA PRESENT   Lipase, blood     Status: None   Collection Time: 08/24/16  3:42 PM  Result Value Ref Range   Lipase 25 11 - 51 U/L  Comprehensive metabolic panel     Status: Abnormal   Collection Time: 08/24/16  3:42 PM  Result Value Ref Range   Sodium 137 135 - 145 mmol/L   Potassium 3.5 3.5 - 5.1 mmol/L   Chloride 100 (L) 101 - 111 mmol/L   CO2 26 22 - 32 mmol/L   Glucose, Bld 140 (H) 65 - 99 mg/dL   BUN 18 6 - 20 mg/dL   Creatinine, Ser 0.75 0.61 - 1.24 mg/dL   Calcium 9.6 8.9 - 10.3 mg/dL   Total Protein 7.4 6.5 - 8.1 g/dL   Albumin 4.0 3.5 - 5.0 g/dL   AST 25 15 - 41 U/L   ALT 13 (L) 17 - 63 U/L   Alkaline Phosphatase 90 38 - 126 U/L   Total Bilirubin 0.7 0.3 - 1.2 mg/dL   GFR calc non Af Amer >60 >60 mL/min   GFR calc Af Amer >60 >60 mL/min    Comment: (NOTE) The eGFR has been calculated using the CKD EPI equation. This calculation has not been validated in all clinical situations. eGFR's persistently <60 mL/min signify possible Chronic Kidney Disease.    Anion gap 11 5 - 15  CBC     Status: Abnormal   Collection Time: 08/24/16  3:42 PM  Result Value Ref Range   WBC 14.2 (H) 3.8 - 10.6 K/uL   RBC 3.95 (L) 4.40 - 5.90 MIL/uL  Hemoglobin 13.1 13.0 - 18.0 g/dL   HCT 38.1 (L) 40.0 - 52.0 %   MCV 96.6 80.0 - 100.0 fL   MCH  33.3 26.0 - 34.0 pg   MCHC 34.5 32.0 - 36.0 g/dL   RDW 13.6 11.5 - 14.5 %   Platelets 362 150 - 440 K/uL  Troponin I     Status: None   Collection Time: 08/24/16  3:42 PM  Result Value Ref Range   Troponin I <0.03 <0.03 ng/mL  Differential     Status: Abnormal   Collection Time: 08/24/16  3:42 PM  Result Value Ref Range   Neutrophils Relative % 76 %   Neutro Abs 10.5 (H) 1.4 - 6.5 K/uL   Lymphocytes Relative 14 %   Lymphs Abs 1.9 1.0 - 3.6 K/uL   Monocytes Relative 10 %   Monocytes Absolute 1.4 (H) 0.2 - 1.0 K/uL   Eosinophils Relative 0 %   Eosinophils Absolute 0.0 0 - 0.7 K/uL   Basophils Relative 0 %   Basophils Absolute 0.1 0 - 0.1 K/uL  Protime-INR     Status: None   Collection Time: 08/24/16  3:42 PM  Result Value Ref Range   Prothrombin Time 13.7 11.4 - 15.2 seconds   INR 1.05   APTT     Status: Abnormal   Collection Time: 08/24/16  3:42 PM  Result Value Ref Range   aPTT 37 (H) 24 - 36 seconds    Comment:        IF BASELINE aPTT IS ELEVATED, SUGGEST PATIENT RISK ASSESSMENT BE USED TO DETERMINE APPROPRIATE ANTICOAGULANT THERAPY.   Basic metabolic panel     Status: Abnormal   Collection Time: 08/25/16  5:44 AM  Result Value Ref Range   Sodium 137 135 - 145 mmol/L   Potassium 3.4 (L) 3.5 - 5.1 mmol/L   Chloride 101 101 - 111 mmol/L   CO2 27 22 - 32 mmol/L   Glucose, Bld 109 (H) 65 - 99 mg/dL   BUN 12 6 - 20 mg/dL   Creatinine, Ser 0.52 (L) 0.61 - 1.24 mg/dL   Calcium 8.8 (L) 8.9 - 10.3 mg/dL   GFR calc non Af Amer >60 >60 mL/min   GFR calc Af Amer >60 >60 mL/min    Comment: (NOTE) The eGFR has been calculated using the CKD EPI equation. This calculation has not been validated in all clinical situations. eGFR's persistently <60 mL/min signify possible Chronic Kidney Disease.    Anion gap 9 5 - 15  Heparin level (unfractionated)     Status: None   Collection Time: 08/25/16  5:44 AM  Result Value Ref Range   Heparin Unfractionated 0.50 0.30 - 0.70 IU/mL     Comment:        IF HEPARIN RESULTS ARE BELOW EXPECTED VALUES, AND PATIENT DOSAGE HAS BEEN CONFIRMED, SUGGEST FOLLOW UP TESTING OF ANTITHROMBIN III LEVELS.   Heparin level (unfractionated)     Status: None   Collection Time: 08/25/16 10:52 PM  Result Value Ref Range   Heparin Unfractionated 0.40 0.30 - 0.70 IU/mL    Comment:        IF HEPARIN RESULTS ARE BELOW EXPECTED VALUES, AND PATIENT DOSAGE HAS BEEN CONFIRMED, SUGGEST FOLLOW UP TESTING OF ANTITHROMBIN III LEVELS.   CBC     Status: Abnormal   Collection Time: 08/26/16  5:48 AM  Result Value Ref Range   WBC 13.6 (H) 3.8 - 10.6 K/uL   RBC 3.47 (L) 4.40 - 5.90  MIL/uL   Hemoglobin 11.5 (L) 13.0 - 18.0 g/dL   HCT 33.6 (L) 40.0 - 52.0 %   MCV 97.0 80.0 - 100.0 fL   MCH 33.3 26.0 - 34.0 pg   MCHC 34.4 32.0 - 36.0 g/dL   RDW 13.5 11.5 - 14.5 %   Platelets 298 150 - 440 K/uL  Heparin level (unfractionated)     Status: Abnormal   Collection Time: 08/26/16  5:48 AM  Result Value Ref Range   Heparin Unfractionated 0.25 (L) 0.30 - 0.70 IU/mL    Comment:        IF HEPARIN RESULTS ARE BELOW EXPECTED VALUES, AND PATIENT DOSAGE HAS BEEN CONFIRMED, SUGGEST FOLLOW UP TESTING OF ANTITHROMBIN III LEVELS.   Heparin level (unfractionated)     Status: None   Collection Time: 08/26/16  2:58 PM  Result Value Ref Range   Heparin Unfractionated 0.52 0.30 - 0.70 IU/mL    Comment:        IF HEPARIN RESULTS ARE BELOW EXPECTED VALUES, AND PATIENT DOSAGE HAS BEEN CONFIRMED, SUGGEST FOLLOW UP TESTING OF ANTITHROMBIN III LEVELS.     Radiology Ct Angio Abd/pel W And/or Wo Contrast  Result Date: 08/24/2016 CLINICAL DATA:  75 year old male with abdominal pain and vomiting EXAM: CTA ABDOMEN AND PELVIS wITHOUT AND WITH CONTRAST TECHNIQUE: Multidetector CT imaging of the abdomen and pelvis was performed using the standard protocol during bolus administration of intravenous contrast. Multiplanar reconstructed images and MIPs were obtained and  reviewed to evaluate the vascular anatomy. CONTRAST:  100 cc Isovue 370 COMPARISON:  None. FINDINGS: There is minimal bibasilar atelectatic changes. Right lung base linear scarring noted. There is advanced coronary vascular calcification. No intra-abdominal free air or free fluid. Small gallstone. No pericholecystic fluid. The liver, pancreas, spleen, adrenal glands, kidneys, visualized ureters appear unremarkable. There is thickened appearance of the anterior bladder wall which although may partly be related to underdistention, cystitis or an infiltrative process is not excluded. Correlation with urinalysis recommended. Cystoscopy may provide better evaluation. The prostate and seminal vesicles are grossly unremarkable. There is extensive sigmoid diverticulosis without active inflammatory changes. There is no evidence of bowel obstruction or active inflammation. No pneumatosis. Normal appendix. There is advanced aortoiliac atherosclerotic disease. An aorto bi-iliac endovascular stent graft noted which appears patent. There is no aortic aneurysm or dissection. 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 No significant flow identified within the origin of the celiac axis. There is diminished flow within the splenic artery, common hepatic artery, and left gastric artery. There is dense atherosclerotic calcification of the origin of the SMA with high-grade stenosis of approximately 3 cm segment of the SMA. The origin of the IMA appears patent. There is advanced atherosclerotic calcification of the origins of the renal arteries. The renal arteries however remain patent. 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 The common iliac arteries are patent. There is severe atherosclerotic calcification of the internal iliac  arteries. A right external iliac artery stent is noted and appears patent. There is atherosclerotic calcification of the distal left external iliac artery. The left external iliac artery remains patent. There is atherosclerotic calcification with partial luminal narrowing of the left common femoral artery. There is complete occlusion of the visualized superficial femoral artery on the left. No portal venous gas identified. There is  a retro aortic left renal vein anatomy. There is no adenopathy. There is a small fat containing umbilical hernia. There is osteopenia with degenerative changes of the spine. No acute fracture identified. 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 IMPRESSION: VASCULAR No CT evidence of aortic dissection or aneurysm. 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 Complete occlusion of the left SFA. No portal venous gas. NON-VASCULAR No evidence of bowel obstruction or active inflammation. No pneumatosis. Colonic diverticulosis without active inflammatory changes. Electronically Signed   By: Anner Crete M.D.   On: 08/24/2016 19:26      Assessment/Plan  Mixed hyperlipidemia lipid control important in reducing the progression of atherosclerotic disease. Continue statin therapy   PAD (peripheral artery disease) ([facility]) Stable.  To be checked again early next year.  HTN (hypertension) blood pressure control important in reducing the progression of atherosclerotic disease. On appropriate oral medications.   Mesenteric ischemia ([facility]) Markedly improved after SMA intervention last month. He has started gaining weight and eating better.  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 He has  done quite well since this procedure. His access site is well-healed. He is scheduled to return for a follow-up duplex in several months. He will contact our office with any problems in the interim. I am cutting his dose of picks up and back to 2.5 mg twice a day. We will do this for 3 months and then he can return to aspirin therapy.   Leotis Pain, MD  09/19/2016 3:30 PM    This note was created with Dragon medical transcription system.  Any errors from dictation are purely unintentional

## 2016-09-19 NOTE — Assessment & Plan Note (Signed)
blood pressure control important in reducing the progression of atherosclerotic disease. On appropriate oral medications.  

## 2016-09-19 NOTE — Assessment & Plan Note (Signed)
Stable.  To be checked again early next year.

## 2016-10-01 DIAGNOSIS — D509 Iron deficiency anemia, unspecified: Secondary | ICD-10-CM | POA: Insufficient documentation

## 2016-10-01 NOTE — Progress Notes (Signed)
[facility]  [phone] [phone]  ID: [name] OB: 12/07/1941  MR#: [id number]  [id number]  Patient Care Team: No Pcp Per Patient as PCP - General (General Practice)  CHIEF COMPLAINT: Abnormal CBC with leukocytosis and thrombocytosis.  INTERVAL HISTORY: Patient returns to clinic today for further evaluation and discussion of his laboratory results. He currently feels well and is asymptomatic. He has no neurological complaints. He denies any recent fevers or illnesses. He denies any chest pain or shortness of breath. He denies any nausea, vomiting, constipation, or diarrhea. He has no melena or hematochezia. He has no urinary complaints. Patient otherwise feels well and offers no further specific complaints.  REVIEW OF SYSTEMS:   Review of Systems  Constitutional: Negative.  Negative for fever, malaise/fatigue and weight loss.  Respiratory: Negative.  Negative for cough and shortness of breath.   Cardiovascular: Negative.  Negative for chest pain and leg swelling.  Gastrointestinal: Negative for abdominal pain, blood in stool, melena and nausea.  Musculoskeletal: Positive for joint pain.  Neurological: Negative.  Negative for weakness.  Psychiatric/Behavioral: Negative.  The patient is not nervous/anxious.     As per HPI. Otherwise, a complete review of systems is negative.  PAST MEDICAL HISTORY: Past Medical History:  Diagnosis Date  . HLD (hyperlipidemia)   . Hypertension   . PVD (peripheral vascular disease) ([place])     PAST SURGICAL HISTORY: Past Surgical History:  Procedure Laterality Date  . PERIPHERAL VASCULAR CATHETERIZATION N/A 06/15/2015   Procedure: Abdominal Aortogram w/Lower Extremity;  Surgeon: Katha Cabal, MD;  Location: [facility];  Service: Cardiovascular;  Laterality: N/A;  . PERIPHERAL VASCULAR CATHETERIZATION  06/15/2015   Procedure: Lower Extremity Intervention;  Surgeon: Katha Cabal,  MD;  Location: [facility];  Service: Cardiovascular;;  . PERIPHERAL VASCULAR CATHETERIZATION N/A 08/25/2016   Procedure: Abdominal Aortogram;  Surgeon: Algernon Huxley, MD;  Location: [facility];  Service: Cardiovascular;  Laterality: N/A;  . PERIPHERAL VASCULAR CATHETERIZATION N/A 08/25/2016   Procedure: Visceral Artery Intervention;  Surgeon: Algernon Huxley, MD;  Location: [facility];  Service: Cardiovascular;  Laterality: N/A;    FAMILY HISTORY: Family History  Problem Relation Age of Onset  . Heart attack Mother   . Heart attack Father   . Basal cell carcinoma Father     ADVANCED DIRECTIVES (Y/N):  N  HEALTH MAINTENANCE: Social History  Substance Use Topics  . Smoking status: Former Smoker    Types: E-cigarettes  . Smokeless tobacco: Current User  . Alcohol use 1.2 oz/week    2 Cans of beer per week     Colonoscopy:  PAP:  Bone density:  Lipid panel:  Allergies  Allergen Reactions  . Eliquis [Apixaban] Other (See Comments)    causes nose bleeds  . Oxycodone-Acetaminophen Other (See Comments)  . Penicillins Rash    Current Outpatient Prescriptions  Medication Sig Dispense Refill  . aspirin EC 81 MG EC tablet Take 1 tablet (81 mg total) by mouth daily. 30 tablet 11  . atenolol (TENORMIN) 25 MG tablet Take 25 mg by mouth daily.    Marland Kitchen diltiazem (CARDIZEM) 120 MG tablet Take 120 mg by mouth daily.      No current facility-administered medications for this visit.     OBJECTIVE: Vitals:   10/02/16 1116  BP: (!) 160/90  Pulse: 70  Resp: 18  Temp: 97.1 F (36.2 C)     Body mass index is 23.24  kg/m.    ECOG FS:0 - Asymptomatic  General: Well-developed, well-nourished, no acute distress. Eyes: Pink conjunctiva, anicteric sclera. Lungs: Clear to auscultation bilaterally. Heart: Regular rate and rhythm. No rubs, murmurs, or gallops. Abdomen: Soft, nontender, nondistended. No organomegaly noted, normoactive bowel sounds. Musculoskeletal: No  edema, cyanosis, or clubbing. Neuro: Alert, answering all questions appropriately. Cranial nerves grossly intact. Skin: No rashes or petechiae noted. Psych: Normal affect.   LAB RESULTS:  Lab Results  Component Value Date   NA 137 08/25/2016   K 3.4 (L) 08/25/2016   CL 101 08/25/2016   CO2 27 08/25/2016   GLUCOSE 109 (H) 08/25/2016   BUN 12 08/25/2016   CREATININE 0.52 (L) 08/25/2016   CALCIUM 8.8 (L) 08/25/2016   PROT 7.4 08/24/2016   ALBUMIN 4.0 08/24/2016   AST 25 08/24/2016   ALT 13 (L) 08/24/2016   ALKPHOS 90 08/24/2016   BILITOT 0.7 08/24/2016   GFRNONAA >60 08/25/2016   GFRAA >60 08/25/2016    Lab Results  Component Value Date   WBC 13.6 (H) 08/26/2016   NEUTROABS 10.5 (H) 08/24/2016   HGB 11.5 (L) 08/26/2016   HCT 33.6 (L) 08/26/2016   MCV 97.0 08/26/2016   PLT 298 08/26/2016     STUDIES: No results found.  ASSESSMENT: Abnormal CBC with leukocytosis and thrombocytosis.  PLAN:    1. Leukocytosis: Likely reactive with patient's ongoing abdominal vascular issues. BCR-ABL as well as peripheral blood flow cytometry are negative. The remainder of his laboratory work, including SPEP, is either negative or within normal limits. No further intervention is needed. After discussion with the patient, he has agreed to get his laboratory rechecked at the [facility] in [place] this coming March. If there is any concern to refer him back for further evaluation. No follow-up has been scheduled at this time. 2. Thrombocytosis: Resolved. 3. Anemia: Patient's hemoglobin is mildly decreased and he also has mildly decreased iron stores. Have recommended oral iron supplementation.  4. Abdominal pain: Continue to evaluation and treatment per vascular surgery.  Patient expressed understanding and was in agreement with this plan. He also understands that He can call clinic at any time with any questions, concerns, or complaints.    Lloyd Huger, MD   10/02/2016 6:12 PM

## 2016-10-02 ENCOUNTER — Inpatient Hospital Stay: Payer: Medicare Other | Attending: Oncology | Admitting: Oncology

## 2016-10-02 VITALS — BP 160/90 | HR 70 | Temp 97.1°F | Resp 18 | Wt 144.0 lb

## 2016-10-02 DIAGNOSIS — I10 Essential (primary) hypertension: Secondary | ICD-10-CM | POA: Insufficient documentation

## 2016-10-02 DIAGNOSIS — E785 Hyperlipidemia, unspecified: Secondary | ICD-10-CM | POA: Diagnosis not present

## 2016-10-02 DIAGNOSIS — D7589 Other specified diseases of blood and blood-forming organs: Secondary | ICD-10-CM | POA: Diagnosis not present

## 2016-10-02 DIAGNOSIS — Z87891 Personal history of nicotine dependence: Secondary | ICD-10-CM | POA: Insufficient documentation

## 2016-10-02 DIAGNOSIS — D72829 Elevated white blood cell count, unspecified: Secondary | ICD-10-CM | POA: Insufficient documentation

## 2016-10-02 DIAGNOSIS — I739 Peripheral vascular disease, unspecified: Secondary | ICD-10-CM | POA: Insufficient documentation

## 2016-10-02 DIAGNOSIS — D649 Anemia, unspecified: Secondary | ICD-10-CM | POA: Diagnosis not present

## 2016-10-02 DIAGNOSIS — D508 Other iron deficiency anemias: Secondary | ICD-10-CM

## 2016-10-02 DIAGNOSIS — R109 Unspecified abdominal pain: Secondary | ICD-10-CM | POA: Diagnosis not present

## 2016-10-02 DIAGNOSIS — Z79899 Other long term (current) drug therapy: Secondary | ICD-10-CM | POA: Diagnosis not present

## 2016-10-02 DIAGNOSIS — D72828 Other elevated white blood cell count: Secondary | ICD-10-CM

## 2016-10-02 DIAGNOSIS — Z7982 Long term (current) use of aspirin: Secondary | ICD-10-CM | POA: Insufficient documentation

## 2016-10-02 NOTE — Progress Notes (Signed)
States has right shoulder pain/discomfort today due to previous shoulder injury.

## 2016-10-31 ENCOUNTER — Ambulatory Visit: Admit: 2016-10-31 | Payer: Medicare Other | Admitting: Gastroenterology

## 2016-10-31 SURGERY — ESOPHAGOGASTRODUODENOSCOPY (EGD) WITH PROPOFOL
Anesthesia: General

## 2016-11-07 ENCOUNTER — Encounter (INDEPENDENT_AMBULATORY_CARE_PROVIDER_SITE_OTHER): Payer: Self-pay | Admitting: Vascular Surgery

## 2016-11-07 ENCOUNTER — Telehealth (INDEPENDENT_AMBULATORY_CARE_PROVIDER_SITE_OTHER): Payer: Self-pay

## 2016-11-07 ENCOUNTER — Ambulatory Visit (INDEPENDENT_AMBULATORY_CARE_PROVIDER_SITE_OTHER): Payer: Medicare Other | Admitting: Vascular Surgery

## 2016-11-07 ENCOUNTER — Encounter (INDEPENDENT_AMBULATORY_CARE_PROVIDER_SITE_OTHER): Payer: Self-pay

## 2016-11-07 ENCOUNTER — Other Ambulatory Visit (INDEPENDENT_AMBULATORY_CARE_PROVIDER_SITE_OTHER): Payer: Self-pay | Admitting: Vascular Surgery

## 2016-11-07 VITALS — BP 135/73 | HR 86 | Resp 17 | Ht 67.0 in | Wt 140.0 lb

## 2016-11-07 DIAGNOSIS — K55069 Acute infarction of intestine, part and extent unspecified: Secondary | ICD-10-CM | POA: Diagnosis not present

## 2016-11-07 DIAGNOSIS — K559 Vascular disorder of intestine, unspecified: Secondary | ICD-10-CM | POA: Diagnosis not present

## 2016-11-07 DIAGNOSIS — I748 Embolism and thrombosis of other arteries: Secondary | ICD-10-CM

## 2016-11-07 DIAGNOSIS — E782 Mixed hyperlipidemia: Secondary | ICD-10-CM | POA: Diagnosis not present

## 2016-11-07 NOTE — Telephone Encounter (Signed)
Patient was called and given an appointment for today. See below.

## 2016-11-07 NOTE — Telephone Encounter (Signed)
Patient called complaining of a very bad stomach ache, that started a couple of days ago with no other symptoms.

## 2016-11-07 NOTE — Progress Notes (Signed)
Subjective:    Patient ID: [name], male    DOB: 1941/11/08, 75 y.o.   MRN: [id number] Chief Complaint  Patient presents with  . Re-evaluation    Stomach pain   Patient called office this AM complaining of worsening abdominal pain asking to be seen. 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 He is s/p a percutaneous transluminal angioplasty of the SMA with a 4 mm diameter x 6 mm length Lutonix drug-coated angioplasty balloon, catheter directed thrombolytic therapy for treatment of SMA thrombosis with 8 mg of TPA and stent to the SMA with 6 mm diameter x 26 mm length Lifestream balloon expandable stent on 08/25/16. Patient has been taking his Eliquis. He is complaining of three days of worsening abdominal pain similar in nature to the pain he experienced before his intervention. Abdominal pain is generalized. He denies any nausea or vomiting with or without food. States his "stools" are loose but not bloody. Denies any fever.    Review of Systems  Constitutional: Positive for appetite change.  HENT: Negative.   Eyes: Negative.   Respiratory: Negative.   Cardiovascular: Negative.   Gastrointestinal: Positive for abdominal pain and diarrhea.  Endocrine: Negative.   Genitourinary: Negative.   Musculoskeletal: Negative.   Skin: Negative.   Allergic/Immunologic: Negative.   Neurological: Negative.   Hematological: Negative.   Psychiatric/Behavioral: Negative.       Objective:   Physical Exam  Constitutional: He is oriented to person, place, and time. He appears well-developed and well-nourished.  HENT:  Head: Normocephalic and atraumatic.  Right Ear: External ear normal.  Left Ear: External ear normal.  Eyes: Conjunctivae and EOM are normal. Pupils are equal, round, and reactive to light.  Neck: Normal range of motion.  Cardiovascular: Normal rate, regular rhythm, normal heart sounds and intact distal pulses.   Pulses:  Radial pulses are 2+ on the right side, and 2+ on the left side.       Dorsalis pedis pulses are 2+ on the right side, and 2+ on the left side.       Posterior tibial pulses are 2+ on the right side, and 2+ on the left side.  Pulmonary/Chest: Effort normal and breath sounds normal.  Abdominal: Bowel sounds are normal. He exhibits no distension. There is no tenderness. There is no rebound and no guarding.  Musculoskeletal: Normal range of motion. He exhibits no edema.  Neurological: He is alert and oriented to person, place, and time.  Skin: Skin is warm and dry.  Psychiatric: He has a normal mood and affect. His behavior is normal. Judgment and thought content normal.   BP 135/73 (BP Location: Left Arm)   Pulse 86   Resp 17   Ht 5\' 7"  (1.702 m)   Wt 140 lb (63.5 kg)   BMI 21.93 kg/m   Past Medical History:  Diagnosis Date  . HLD (hyperlipidemia)   . Hypertension   . PVD (peripheral vascular disease) ([place])    Social History   Social History  . Marital status: Widowed    Spouse name: N/A  . Number of children: N/A  . Years of education: N/A   Occupational History  . Not on file.   Social History Main Topics  . Smoking status: Former Smoker    Types: E-cigarettes  . Smokeless tobacco: Current User  . Alcohol use 1.2 oz/week    2 Cans of beer per week  . Drug use: No  .  Sexual activity: Not on file   Other Topics Concern  . Not on file   Social History Narrative  . No narrative on file   Past Surgical History:  Procedure Laterality Date  . PERIPHERAL VASCULAR CATHETERIZATION N/A 06/15/2015   Procedure: Abdominal Aortogram w/Lower Extremity;  Surgeon: Katha Cabal, MD;  Location: [facility];  Service: Cardiovascular;  Laterality: N/A;  . PERIPHERAL VASCULAR CATHETERIZATION  06/15/2015   Procedure: Lower Extremity Intervention;  Surgeon: Katha Cabal, MD;  Location: [facility];  Service: Cardiovascular;;  . PERIPHERAL VASCULAR  CATHETERIZATION N/A 08/25/2016   Procedure: Abdominal Aortogram;  Surgeon: Algernon Huxley, MD;  Location: [facility];  Service: Cardiovascular;  Laterality: N/A;  . PERIPHERAL VASCULAR CATHETERIZATION N/A 08/25/2016   Procedure: Visceral Artery Intervention;  Surgeon: Algernon Huxley, MD;  Location: [facility];  Service: Cardiovascular;  Laterality: N/A;   Family History  Problem Relation Age of Onset  . Heart attack Mother   . Heart attack Father   . Basal cell carcinoma Father    Allergies  Allergen Reactions  . Eliquis [Apixaban] Other (See Comments)    causes nose bleeds  . Oxycodone-Acetaminophen Other (See Comments)  . Penicillins Rash      Assessment & Plan:  Patient called office this AM complaining of worsening abdominal pain asking to be seen. 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 He is s/p a percutaneous transluminal angioplasty of the SMA with a 4 mm diameter x 6 mm length Lutonix drug-coated angioplasty balloon, catheter directed thrombolytic therapy for treatment of SMA thrombosis with 8 mg of TPA and stent to the SMA with 6 mm diameter x 26 mm length Lifestream balloon expandable stent on 08/25/16. Patient has been taking his Eliquis. He is complaining of three days of worsening abdominal pain similar in nature to the pain he experienced before his intervention. Abdominal pain is generalized. He denies any nausea or vomiting with or without food. States his "stools" are loose but not bloody. Denies any fever.   1. Mesenteric ischemia (HCC) - Worsening Patient with history of mesenteric  Ischemia with celiac and SMA occlusion s/p SMA stent now with three days of worsening abdominal pain. Recommend mesenteric angiogram to assess if any ischemia is contributing to his pain. Patient encouraged to seek medical attention at ED if abdominal worsens or if he starts to experiences any fever, nausea vomiting or worsening /  bloody diarrhea.   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 Mixed hyperlipidemia - Stable Encouraged good control as its slows the progression of atherosclerotic disease  Current Outpatient Prescriptions on File Prior to Visit  Medication Sig Dispense Refill  . aspirin EC 81 MG EC tablet Take 1 tablet (81 mg total) by mouth daily. 30 tablet 11  . atenolol (TENORMIN) 25 MG tablet Take 25 mg by mouth daily.    Marland Kitchen diltiazem (CARDIZEM) 120 MG tablet Take 120 mg by mouth daily.      No current facility-administered medications on file prior to visit.     There are no Patient Instructions on file for this visit. No Follow-up on file.   Josealberto Montalto A Thiago Ragsdale, PA-C

## 2016-11-08 ENCOUNTER — Telehealth (INDEPENDENT_AMBULATORY_CARE_PROVIDER_SITE_OTHER): Payer: Self-pay

## 2016-11-08 ENCOUNTER — Encounter
Admission: RE | Admit: 2016-11-08 | Discharge: 2016-11-08 | Disposition: A | Discharge status: Home / Self Care | Payer: Medicare Other | Source: Ambulatory Visit | Attending: Vascular Surgery | Admitting: Vascular Surgery

## 2016-11-08 DIAGNOSIS — K55059 Acute (reversible) ischemia of intestine, part and extent unspecified: Secondary | ICD-10-CM | POA: Diagnosis not present

## 2016-11-08 LAB — CREATININE, SERUM
CREATININE: 0.81 mg/dL (ref 0.61–1.24)
GFR calc Af Amer: >60 mL/min (ref >60)
GFR calc non Af Amer: >60 mL/min (ref >60)

## 2016-11-08 LAB — BUN: BUN: 14 mg/dL (ref 6–20)

## 2016-11-08 NOTE — Telephone Encounter (Signed)
Patient called about having stomach pain. I spoke with KS and I told him that if the pain gets worst for him to go to the emergency room.

## 2016-11-08 NOTE — Patient Instructions (Signed)
[facility] VEIN AND VASCULAR SURGERY  Your procedure is scheduled with Dr. Lucky Cowboy                             On:  Date: November 13, 2016               Time: 1:15 pm     On arrival go [facility] on first floor of the [facility]. Your arrival time will be approximately 1-1.5 hours prior to you procedure start time. This allows time to check lab results and prep you for the procedure.  If your procedure time change you will be notified by the doctor's office.    _x____Do not eat or drink 8 hours prior to your procedure.    __x___Take all your morning medications with sips of water.  _____Continue Plavix and/or Aspirin.  _____Take half of your bedtime Insulin.  _____Take half of  your morning Insulin.  _____Bring your current medications in their bottle with you.  _____Do not take Metformin 24 hours before procedure or 48 hours after you procedure.  _____Stop Pradaxa, Xarelto, Coumadin, Effient, or Eliquis for 3 days prior to your procedure.  Your recovery time will be 1-2 hours (1 hour for a  vein stick, 2 hours for an artery stick )  Please call Dr Lucky Cowboy and Dr Nino Parsley office with any questions or concerns: [phone].  You will need to have someone drive you home and stay with you the night of the procedure.

## 2016-11-13 ENCOUNTER — Encounter: Payer: Self-pay | Admitting: *Deleted

## 2016-11-13 ENCOUNTER — Encounter
Admission: RE | Disposition: A | Discharge status: Home / Self Care | Payer: Self-pay | Source: Ambulatory Visit | Attending: Vascular Surgery

## 2016-11-13 ENCOUNTER — Ambulatory Visit
Admission: RE | Admit: 2016-11-13 | Discharge: 2016-11-13 | Disposition: A | Discharge status: Home / Self Care | Payer: Medicare Other | Source: Ambulatory Visit | Attending: Vascular Surgery | Admitting: Vascular Surgery

## 2016-11-13 DIAGNOSIS — I739 Peripheral vascular disease, unspecified: Secondary | ICD-10-CM | POA: Insufficient documentation

## 2016-11-13 DIAGNOSIS — Z7982 Long term (current) use of aspirin: Secondary | ICD-10-CM

## 2016-11-13 DIAGNOSIS — E785 Hyperlipidemia, unspecified: Secondary | ICD-10-CM | POA: Insufficient documentation

## 2016-11-13 DIAGNOSIS — Z885 Allergy status to narcotic agent status: Secondary | ICD-10-CM

## 2016-11-13 DIAGNOSIS — Z880 Allergy status to penicillin: Secondary | ICD-10-CM | POA: Insufficient documentation

## 2016-11-13 DIAGNOSIS — I10 Essential (primary) hypertension: Secondary | ICD-10-CM | POA: Insufficient documentation

## 2016-11-13 DIAGNOSIS — Z87891 Personal history of nicotine dependence: Secondary | ICD-10-CM | POA: Insufficient documentation

## 2016-11-13 DIAGNOSIS — Z888 Allergy status to other drugs, medicaments and biological substances status: Secondary | ICD-10-CM | POA: Insufficient documentation

## 2016-11-13 DIAGNOSIS — Z8249 Family history of ischemic heart disease and other diseases of the circulatory system: Secondary | ICD-10-CM

## 2016-11-13 DIAGNOSIS — K551 Chronic vascular disorders of intestine: Secondary | ICD-10-CM

## 2016-11-13 DIAGNOSIS — T82868A Thrombosis of vascular prosthetic devices, implants and grafts, initial encounter: Secondary | ICD-10-CM | POA: Diagnosis not present

## 2016-11-13 DIAGNOSIS — K529 Noninfective gastroenteritis and colitis, unspecified: Secondary | ICD-10-CM | POA: Diagnosis not present

## 2016-11-13 HISTORY — PX: PERIPHERAL VASCULAR CATHETERIZATION: SHX172C

## 2016-11-13 HISTORY — DX: Vomiting, unspecified: R11.10

## 2016-11-13 SURGERY — VISCERAL ANGIOGRAPHY
Anesthesia: Moderate Sedation

## 2016-11-13 MED ORDER — LABETALOL HCL 5 MG/ML IV SOLN: 10.0000 mg | INTRAVENOUS | Status: DC | PRN

## 2016-11-13 MED ORDER — FENTANYL CITRATE (PF) 100 MCG/2ML IJ SOLN
INTRAMUSCULAR | Status: AC
  Filled 2016-11-13: qty 2

## 2016-11-13 MED ORDER — FAMOTIDINE 20 MG PO TABS: 40.0000 mg | ORAL_TABLET | ORAL | Status: DC | PRN

## 2016-11-13 MED ORDER — HEPARIN SODIUM (PORCINE) 1000 UNIT/ML IJ SOLN
INTRAMUSCULAR | Status: AC
Start: 2016-11-13 — End: 2016-11-13
  Filled 2016-11-13: qty 1

## 2016-11-13 MED ORDER — HEPARIN (PORCINE) IN NACL 2-0.9 UNIT/ML-% IJ SOLN
INTRAMUSCULAR | Status: AC
  Filled 2016-11-13: qty 1000

## 2016-11-13 MED ORDER — MIDAZOLAM HCL 5 MG/5ML IJ SOLN
INTRAMUSCULAR | Status: AC
  Filled 2016-11-13: qty 5

## 2016-11-13 MED ORDER — HYDROMORPHONE HCL 1 MG/ML IJ SOLN: 1.0000 mg | Freq: Once | INTRAMUSCULAR | Status: DC

## 2016-11-13 MED ORDER — RIVAROXABAN 20 MG PO TABS: 20.0000 mg | ORAL_TABLET | Freq: Every day | ORAL | 3 refills | Status: DC

## 2016-11-13 MED ORDER — CLINDAMYCIN PHOSPHATE 300 MG/50ML IV SOLN: 300.0000 mg | Freq: Once | INTRAVENOUS | Status: DC

## 2016-11-13 MED ORDER — ASPIRIN EC 81 MG PO TBEC: 81.0000 mg | DELAYED_RELEASE_TABLET | Freq: Every day | ORAL | 2 refills | Status: DC

## 2016-11-13 MED ORDER — HEPARIN SODIUM (PORCINE) 1000 UNIT/ML IJ SOLN
INTRAMUSCULAR | Status: DC | PRN
  Administered 2016-11-13: 5000 [IU] via INTRAVENOUS

## 2016-11-13 MED ORDER — HYDROMORPHONE HCL 1 MG/ML IJ SOLN: 0.5000 mg | INTRAMUSCULAR | Status: DC | PRN

## 2016-11-13 MED ORDER — CLINDAMYCIN PHOSPHATE 300 MG/50ML IV SOLN
INTRAVENOUS | Status: AC
  Filled 2016-11-13: qty 50

## 2016-11-13 MED ORDER — IOPAMIDOL (ISOVUE-300) INJECTION 61%
INTRAVENOUS | Status: DC | PRN
  Administered 2016-11-13: 50 mL via INTRA_ARTERIAL

## 2016-11-13 MED ORDER — MIDAZOLAM HCL 2 MG/2ML IJ SOLN
INTRAMUSCULAR | Status: AC
  Filled 2016-11-13: qty 2

## 2016-11-13 MED ORDER — METOPROLOL TARTRATE 5 MG/5ML IV SOLN: 2.0000 mg | INTRAVENOUS | Status: DC | PRN

## 2016-11-13 MED ORDER — LIDOCAINE HCL (PF) 1 % IJ SOLN
INTRAMUSCULAR | Status: AC
  Filled 2016-11-13: qty 30

## 2016-11-13 MED ORDER — ONDANSETRON HCL 4 MG/2ML IJ SOLN: 4.0000 mg | Freq: Four times a day (QID) | INTRAMUSCULAR | Status: DC | PRN

## 2016-11-13 MED ORDER — ACETAMINOPHEN 325 MG RE SUPP: 325.0000 mg | RECTAL | Status: DC | PRN

## 2016-11-13 MED ORDER — GUAIFENESIN-DM 100-10 MG/5ML PO SYRP
15.0000 mL | ORAL_SOLUTION | ORAL | Status: DC | PRN
Start: 2016-11-13 — End: 2016-11-13

## 2016-11-13 MED ORDER — PHENOL 1.4 % MT LIQD: 1.0000 | OROMUCOSAL | Status: DC | PRN

## 2016-11-13 MED ORDER — FENTANYL CITRATE (PF) 100 MCG/2ML IJ SOLN
INTRAMUSCULAR | Status: DC | PRN
  Administered 2016-11-13 (×7): 50 ug via INTRAVENOUS

## 2016-11-13 MED ORDER — METHYLPREDNISOLONE SODIUM SUCC 125 MG IJ SOLR: 125.0000 mg | INTRAMUSCULAR | Status: DC | PRN

## 2016-11-13 MED ORDER — ATORVASTATIN CALCIUM 20 MG PO TABS: 20.0000 mg | ORAL_TABLET | Freq: Every day | ORAL | 3 refills | Status: DC

## 2016-11-13 MED ORDER — ACETAMINOPHEN 325 MG PO TABS: 325.0000 mg | ORAL_TABLET | ORAL | Status: DC | PRN

## 2016-11-13 MED ORDER — SODIUM CHLORIDE 0.9 % IV SOLN: 500.0000 mL | Freq: Once | INTRAVENOUS | Status: DC | PRN

## 2016-11-13 MED ORDER — SODIUM CHLORIDE 0.9 % IV SOLN
INTRAVENOUS | Status: DC
  Administered 2016-11-13: 14:00:00 via INTRAVENOUS

## 2016-11-13 MED ORDER — HYDRALAZINE HCL 20 MG/ML IJ SOLN: 5.0000 mg | INTRAMUSCULAR | Status: DC | PRN

## 2016-11-13 MED ORDER — MIDAZOLAM HCL 2 MG/2ML IJ SOLN
INTRAMUSCULAR | Status: DC | PRN
  Administered 2016-11-13 (×2): 1 mg via INTRAVENOUS
  Administered 2016-11-13: 2 mg via INTRAVENOUS
  Administered 2016-11-13 (×4): 1 mg via INTRAVENOUS

## 2016-11-13 MED ORDER — OXYCODONE-ACETAMINOPHEN 5-325 MG PO TABS: 1.0000 | ORAL_TABLET | ORAL | Status: DC | PRN

## 2016-11-13 SURGICAL SUPPLY — 20 items
BALLN DORADO 7X20X80 (BALLOONS) ×3
BALLN LUTONIX DCB 5X60X130 (BALLOONS) ×6
BALLOON DORADO 7X20X80 (BALLOONS) ×1 IMPLANT
BALLOON LUTONIX DCB 5X60X130 (BALLOONS) ×2 IMPLANT
CANNULA 5F STIFF (CANNULA) ×3 IMPLANT
CATH 5FR JR4 DIAGNOSTIC (CATHETERS) ×3 IMPLANT
CATH 5FR REUT (CATHETERS) ×3 IMPLANT
CATH C2 65CM (CATHETERS) ×3 IMPLANT
CATH PIG 70CM (CATHETERS) ×3 IMPLANT
CATH RIM 65CM (CATHETERS) ×3 IMPLANT
DEVICE PRESTO INFLATION (MISCELLANEOUS) ×3 IMPLANT
DEVICE SOLENT PROXI 90CM (CATHETERS) ×3 IMPLANT
DEVICE STARCLOSE SE CLOSURE (Vascular Products) ×3 IMPLANT
GLIDEWIRE ADV .035X180CM (WIRE) ×3 IMPLANT
PACK ANGIOGRAPHY (CUSTOM PROCEDURE TRAY) ×3 IMPLANT
SHEATH ANL2 6FRX45 HC (SHEATH) ×3 IMPLANT
SHEATH BRITE TIP 5FRX11 (SHEATH) ×3 IMPLANT
TOWEL OR 17X26 4PK STRL BLUE (TOWEL DISPOSABLE) ×3 IMPLANT
WIRE J 3MM .035X145CM (WIRE) ×3 IMPLANT
WIRE MAGIC TORQUE 260C (WIRE) ×3 IMPLANT

## 2016-11-13 NOTE — H&P (Signed)
[facility] History & Physical Update  The patient was interviewed and re-examined.  The patient's previous History and Physical has been reviewed and is unchanged.  There is no change in the plan of care. We plan to proceed with the scheduled procedure.  Leotis Pain, MD  11/13/2016, 1:55 PM

## 2016-11-13 NOTE — Op Note (Signed)
[facility] Percutaneous Study/Intervention Procedural Note   Date: 11/13/2016  Surgeon(s): Leotis Pain, MD  Assistants: none  Pre-operative Diagnosis: 1.  Acute on chronic Mesenteric ischemia 2. 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 Ultrasound guidance for vascular access left femoral artery  2. Catheter placement into SMA branches from left femoral approach 3. Aortogram and selective angiogram of the superior mesenteric artery             4. Catheter directed thrombolytic therapy with 4 mg of TPA delivered and a power pulse spray fashion to the SMA with the AngioJet proxy catheter 5. Mechanical rheolytic thrombectomy to the SMA with the AngioJet proxy catheter             6.  Percutaneous transluminal angioplasty of the SMA stent with 5 mm diameter Lutonix drug-coated balloon and 7 mm diameter high pressure angioplasty balloon             7.  StarClose closure device left femoral artery  Contrast: 50  Fluoro time: 18.4  EBL: 10 cc  Anesthesia: Approximately 60 minutes of Moderate conscious sedation using 7 mg of Versed and 300 mcg of Fentanyl  Indications: Patient is a 75 y.o. male who has symptoms consistent with mesenteric ischemia. The patient has previous SMA stent for occlusion with recurrent symptoms recently consistent with acute on chronic mesenteric ischemia. The patient is brought in for angiography for further evaluation and potential treatment. Risks and benefits are discussed and informed consent is obtained  Procedure: The patient was identified and appropriate procedural time out was performed. The patient was then placed supine on the table and prepped and draped in the usual sterile fashion. Moderate conscious sedation was administered during a face to face encounter with the  patient throughout the procedure with my supervision of the RN administering medicines and monitoring the patient's vital signs, pulse oximetry, telemetry and mental status throughout from the start of the procedure until the patient was taken to the recovery room. Ultrasound was used to evaluate the left common femoral artery. It was patent . A digital ultrasound image was acquired. A Seldinger needle was used to access the left common femoral artery under direct ultrasound guidance and a permanent image was performed. A 0.035 J wire was advanced without resistance and a 5Fr sheath was placed. Pigtail catheter was placed into the aorta and an AP aortogram was performed. This demonstrated what appeared to be at least moderate stenosis of the left renal artery and a normal right renal artery. No apparent flow was seen in the celiac or SMA on the original AP image. We transitioned to the lateral projection to image the celiac and SMA. 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 The patient was given 5000 units of IV heparin.We upsized to a 6 Fr sheath.  A JR4 catheter was used to selectively cannulate the SMA after multiple other catheters were unsuccessful including a VS 1 catheter, an Omni Flush catheter, a rim catheter, and a C2 catheter. This demonstrated an occlusion of the SMA stent that I was able to cross with a mild amount of difficulty and confirm intraluminal flow in the secondary SMA branches well beyond the middle colic and primary bifurcation of the SMA. Based on her symptoms and these findings, I elected to treat the SMA to try to improve the patient's  clinical course. I crossed the lesion without difficulty with the AngioJet proxy catheter and delivered 4 mg of TPA and a power pulse spray fashion throughout the occluded SMA stent and the SMA to the primary branches. This was allowed to dwell for about 10  minutes. Mechanical rheolytic thrombectomy was then performed with the AngioJet proxy catheter to the SMA. I then used a 5 mm diameter x 6 mm length Lutonix drug-coated angioplasty balloon to perform percutaneous transluminal angioplasty of the SMA. I inflated the balloon to 14 atm and held the inflation for 1 minute. On completion angiogram following this angioplasty, 50-60% % residual stenosis was identified in the proximal portion of the stent with some deformation of the previously placed stent that was apparent on original imaging. I elected to insufflate this with a 7 mm diameter by 2 cm length high pressure angioplasty balloon. This was inflated to 20 atm for 1 minute. Completion and gram at this point appeared to show brisk flow through the SMA stent and all of the main SMA branches appeared to have brisk flow. There appeared to be less than 20% residual stenosis and no apparent residual thrombus that I can see on imaging. At this point, I elected to terminate the procedure. The diagnostic catheter was removed. StarClose closure device was deployed in usual fashion with excellent hemostatic result. The patient was taken to the recovery room in stable condition having tolerated the procedure well.     Findings:Occlusion of the previous SMA stent that responded to thrombolytic therapy, mechanical rheolytic thrombectomy, and angioplasty with less than 20% residual stenosis seen in the SMA stent at completion  Disposition: Patient was taken to the recovery room in stable condition having tolerated the procedure well.  Complications: None  Leotis Pain 11/13/2016 4:14 PM   This note was created with Dragon Medical transcription system. Any errors in dictation are purely unintentional.

## 2016-11-13 NOTE — Discharge Instructions (Signed)

## 2016-11-14 ENCOUNTER — Emergency Department: Payer: Medicare Other

## 2016-11-14 ENCOUNTER — Encounter: Payer: Self-pay | Admitting: Vascular Surgery

## 2016-11-14 ENCOUNTER — Inpatient Hospital Stay
Admission: EM | Admit: 2016-11-14 | Discharge: 2016-11-16 | DRG: 252 | Disposition: A | Discharge status: Home / Self Care | Payer: Medicare Other | Attending: Vascular Surgery | Admitting: Vascular Surgery

## 2016-11-14 DIAGNOSIS — T82868A Thrombosis of vascular prosthetic devices, implants and grafts, initial encounter: Principal | ICD-10-CM | POA: Diagnosis present

## 2016-11-14 DIAGNOSIS — K559 Vascular disorder of intestine, unspecified: Secondary | ICD-10-CM | POA: Diagnosis present

## 2016-11-14 DIAGNOSIS — E785 Hyperlipidemia, unspecified: Secondary | ICD-10-CM | POA: Diagnosis present

## 2016-11-14 DIAGNOSIS — Z7982 Long term (current) use of aspirin: Secondary | ICD-10-CM

## 2016-11-14 DIAGNOSIS — K551 Chronic vascular disorders of intestine: Secondary | ICD-10-CM | POA: Diagnosis present

## 2016-11-14 DIAGNOSIS — Z87891 Personal history of nicotine dependence: Secondary | ICD-10-CM

## 2016-11-14 DIAGNOSIS — I7090 Unspecified atherosclerosis: Secondary | ICD-10-CM

## 2016-11-14 DIAGNOSIS — Z885 Allergy status to narcotic agent status: Secondary | ICD-10-CM

## 2016-11-14 DIAGNOSIS — K529 Noninfective gastroenteritis and colitis, unspecified: Secondary | ICD-10-CM

## 2016-11-14 DIAGNOSIS — Z85820 Personal history of malignant melanoma of skin: Secondary | ICD-10-CM

## 2016-11-14 DIAGNOSIS — Z808 Family history of malignant neoplasm of other organs or systems: Secondary | ICD-10-CM

## 2016-11-14 DIAGNOSIS — I10 Essential (primary) hypertension: Secondary | ICD-10-CM | POA: Diagnosis present

## 2016-11-14 DIAGNOSIS — K55029 Acute infarction of small intestine, extent unspecified: Secondary | ICD-10-CM | POA: Diagnosis present

## 2016-11-14 DIAGNOSIS — Z8249 Family history of ischemic heart disease and other diseases of the circulatory system: Secondary | ICD-10-CM

## 2016-11-14 DIAGNOSIS — Z888 Allergy status to other drugs, medicaments and biological substances status: Secondary | ICD-10-CM

## 2016-11-14 DIAGNOSIS — I739 Peripheral vascular disease, unspecified: Secondary | ICD-10-CM | POA: Diagnosis present

## 2016-11-14 DIAGNOSIS — Z880 Allergy status to penicillin: Secondary | ICD-10-CM

## 2016-11-14 DIAGNOSIS — D6859 Other primary thrombophilia: Secondary | ICD-10-CM | POA: Diagnosis present

## 2016-11-14 LAB — CBC
HEMATOCRIT: 41.9 % (ref 40.0–52.0)
HEMOGLOBIN: 14.5 g/dL (ref 13.0–18.0)
MCH: 33.2 pg (ref 26.0–34.0)
MCHC: 34.7 g/dL (ref 32.0–36.0)
MCV: 95.8 fL (ref 80.0–100.0)
Platelets: 329 10*3/uL (ref 150–440)
RBC: 4.38 MIL/uL — ABNORMAL LOW (ref 4.40–5.90)
RDW: 14.7 % — ABNORMAL HIGH (ref 11.5–14.5)
WBC: 14.8 10*3/uL — AB (ref 3.8–10.6)

## 2016-11-14 LAB — COMPREHENSIVE METABOLIC PANEL
ALBUMIN: 3.8 g/dL (ref 3.5–5.0)
ALK PHOS: 72 U/L (ref 38–126)
ALT: 18 U/L (ref 17–63)
AST: 34 U/L (ref 15–41)
Anion gap: 12 (ref 5–15)
BILIRUBIN TOTAL: 1 mg/dL (ref 0.3–1.2)
BUN: 19 mg/dL (ref 6–20)
CO2: 29 mmol/L (ref 22–32)
Calcium: 9.6 mg/dL (ref 8.9–10.3)
Chloride: 94 mmol/L — ABNORMAL LOW (ref 101–111)
Creatinine, Ser: 0.78 mg/dL (ref 0.61–1.24)
GFR calc Af Amer: >60 mL/min (ref >60)
GFR calc non Af Amer: >60 mL/min (ref >60)
GLUCOSE: 162 mg/dL — AB (ref 65–99)
POTASSIUM: 3.1 mmol/L — AB (ref 3.5–5.1)
Sodium: 135 mmol/L (ref 135–145)
TOTAL PROTEIN: 7.2 g/dL (ref 6.5–8.1)

## 2016-11-14 LAB — LACTIC ACID, PLASMA
Lactic Acid, Venous: 1.5 mmol/L (ref 0.5–1.9)
Lactic Acid, Venous: 1.5 mmol/L (ref 0.5–1.9)

## 2016-11-14 LAB — LIPASE, BLOOD: Lipase: 20 U/L (ref 11–51)

## 2016-11-14 LAB — PROTIME-INR
INR: 1.11
PROTHROMBIN TIME: 14.4 s (ref 11.4–15.2)

## 2016-11-14 LAB — APTT: APTT: 46 s — AB (ref 24–36)

## 2016-11-14 MED ORDER — ONDANSETRON HCL 4 MG/2ML IJ SOLN
4.0000 mg | Freq: Once | INTRAMUSCULAR | Status: AC
  Administered 2016-11-14: 4 mg via INTRAVENOUS
  Filled 2016-11-14: qty 2

## 2016-11-14 MED ORDER — HEPARIN (PORCINE) IN NACL 100-0.45 UNIT/ML-% IJ SOLN
15.0000 [IU]/kg/h | Freq: Once | INTRAMUSCULAR | Status: DC
  Filled 2016-11-14: qty 250

## 2016-11-14 MED ORDER — IOPAMIDOL (ISOVUE-300) INJECTION 61%
30.0000 mL | Freq: Once | INTRAVENOUS | Status: AC
  Administered 2016-11-14: 30 mL via ORAL

## 2016-11-14 MED ORDER — FENTANYL CITRATE (PF) 100 MCG/2ML IJ SOLN
75.0000 ug | Freq: Once | INTRAMUSCULAR | Status: AC
  Administered 2016-11-14: 75 ug via INTRAVENOUS
  Filled 2016-11-14: qty 2

## 2016-11-14 MED ORDER — HYDROMORPHONE HCL 1 MG/ML IJ SOLN
0.5000 mg | Freq: Once | INTRAMUSCULAR | Status: AC
  Administered 2016-11-14: 0.5 mg via INTRAVENOUS
  Filled 2016-11-14: qty 1

## 2016-11-14 MED ORDER — IOPAMIDOL (ISOVUE-300) INJECTION 61%
100.0000 mL | Freq: Once | INTRAVENOUS | Status: AC | PRN
  Administered 2016-11-14: 100 mL via INTRAVENOUS

## 2016-11-14 MED ORDER — PIPERACILLIN-TAZOBACTAM 3.375 G IVPB
3.3750 g | Freq: Once | INTRAVENOUS | Status: DC
  Administered 2016-11-14: 3.375 g via INTRAVENOUS
  Filled 2016-11-14: qty 50

## 2016-11-14 MED ORDER — SODIUM CHLORIDE 0.9 % IV BOLUS (SEPSIS)
1000.0000 mL | Freq: Once | INTRAVENOUS | Status: AC
  Administered 2016-11-14: 1000 mL via INTRAVENOUS

## 2016-11-14 MED ORDER — SODIUM CHLORIDE 0.9 % IV SOLN
Freq: Once | INTRAVENOUS | Status: AC
  Administered 2016-11-14: 23:00:00 via INTRAVENOUS
  Filled 2016-11-14: qty 500

## 2016-11-14 NOTE — ED Provider Notes (Signed)
[facility] Emergency Department Provider Note  ____________________________________________  Time seen: Approximately 7:55 PM  I have reviewed the triage vital signs and the nursing notes.   HISTORY  Chief Complaint Abdominal Pain    HPI [name] is a 75 y.o. male with recent vascular intervention yesterday presenting with abdominal pain. 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 Today, the patient reports a sharp epigastric and left upper quadrant pain associated with 2 episodes of nonbloody diarrhea and several episodes of nausea and vomiting. No fevers, chills, urinary symptoms. No lightheadedness or syncope.   Past Medical History:  Diagnosis Date  . HLD (hyperlipidemia)   . Hypertension   . PVD (peripheral vascular disease) ([place])   . Vomiting    and abd pain    Patient Active Problem List   Diagnosis Date Noted  . Iron deficiency anemia 10/01/2016  . Leukocytosis 08/27/2016  . HTN (hypertension) 08/24/2016  . 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 Mesenteric ischemia ([place]) 08/24/2016  . PAD (peripheral artery disease) ([place]) 08/24/2016  . History of nonmelanoma skin cancer 03/06/2016  . Mixed hyperlipidemia 06/17/2014    Past Surgical History:  Procedure Laterality Date  . PERIPHERAL VASCULAR CATHETERIZATION N/A 06/15/2015   Procedure: Abdominal Aortogram w/Lower Extremity;  Surgeon: Katha Cabal, MD;  Location: [place] [facility];  Service: Cardiovascular;  Laterality: N/A;  . PERIPHERAL VASCULAR CATHETERIZATION  06/15/2015   Procedure: Lower Extremity Intervention;  Surgeon: Katha Cabal, MD;  Location: [place] [facility];  Service: Cardiovascular;;  . PERIPHERAL VASCULAR CATHETERIZATION N/A 08/25/2016   Procedure: Abdominal Aortogram;  Surgeon: Algernon Huxley, MD;  Location: [place] [facility];   Service: Cardiovascular;  Laterality: N/A;  . PERIPHERAL VASCULAR CATHETERIZATION N/A 08/25/2016   Procedure: Visceral Artery Intervention;  Surgeon: Algernon Huxley, MD;  Location: [place] [facility];  Service: Cardiovascular;  Laterality: N/A;  . PERIPHERAL VASCULAR CATHETERIZATION N/A 11/13/2016   Procedure: Visceral Angiography;  Surgeon: Algernon Huxley, MD;  Location: [place] [facility];  Service: Cardiovascular;  Laterality: N/A;  . TONSILLECTOMY      Current Outpatient Rx  . Order #: [id number] Class: Print  . Order #: [id number] Class: Historical Med  . Order #: [id number] Class: Print  . Order #: [id number] Class: Historical Med  . Order #: [id number] Class: Print    Allergies Eliquis [apixaban]; Oxycodone-acetaminophen; and Penicillins  Family History  Problem Relation Age of Onset  . Heart attack Mother   . Heart attack Father   . Basal cell carcinoma Father     Social History Social History  Substance Use Topics  . Smoking status: Former Smoker    Types: E-cigarettes  . Smokeless tobacco: Former Systems developer  . Alcohol use 1.2 oz/week    2 Cans of beer per week    Review of Systems Constitutional: No fever/chills.No lightheadedness or syncope Eyes: No visual changes. ENT: No sore throat. No congestion or rhinorrhea. Cardiovascular: Denies chest pain. Denies palpitations. Respiratory: Denies shortness of breath.  No cough. Gastrointestinal: Positive epigastric and left upper quadrant abdominal pain.  Positive nausea, positive vomiting.  Positive diarrhea.  No constipation. Genitourinary: Negative for dysuria. Musculoskeletal: Negative for back pain. Skin: Negative for rash. Neurological: Negative for headaches. No focal numbness, tingling or weakness.   10-point ROS otherwise negative.  ____________________________________________   PHYSICAL EXAM:  VITAL SIGNS: ED Triage Vitals  Enc Vitals Group  BP 11/14/16 1945 (!) 149/78     Pulse --      Resp 11/14/16 1945 19      Temp 11/14/16 1945 97.4 F (36.3 C)     Temp Source 11/14/16 1945 Oral     SpO2 11/14/16 1945 97 %     Weight 11/14/16 1947 140 lb (63.5 kg)     Height 11/14/16 1947 5\' 7"  (1.702 m)     Head Circumference --      Peak Flow --      Pain Score 11/14/16 1945 10     Pain Loc --      Pain Edu? --      Excl. in [place]? --     Constitutional: Alert and oriented. Chronically ill appearing but in no acute distress. Answers questions appropriately. Eyes: Conjunctivae are normal without pallor.  EOMI. No scleral icterus. Head: Atraumatic. Nose: No congestion/rhinnorhea. Mouth/Throat: Mucous membranes are moist.  Neck: No stridor.  Supple.   Cardiovascular: Normal rate, regular rhythm. No murmurs, rubs or gallops.  Respiratory: Normal respiratory effort.  No accessory muscle use or retractions. Lungs CTAB.  No wheezes, rales or ronchi. Gastrointestinal: Soft, and nondistended.  Mild tenderness to palpation in the epigastrium and left upper quadrant No guarding or rebound.  No peritoneal signs. GU: Left inguinal crease has an occlusive dressing that is clean dry and intact without any surrounding erythema or swelling. Musculoskeletal: No LE edema. No ttp in the calves or palpable cords.  Negative Homan's sign. Neurologic:  A&Ox3.  Speech is clear.  Face and smile are symmetric.  EOMI.  Moves all extremities well. Skin:  Skin is warm, dry. No rash noted. Psychiatric: Mood and affect are normal. Speech and behavior are normal.  Normal judgement.  ____________________________________________   LABS (all labs ordered are listed, but only abnormal results are displayed)  Labs Reviewed  CBC  COMPREHENSIVE METABOLIC PANEL  LIPASE, BLOOD   ____________________________________________  EKG  ED ECG REPORT I, Eula Listen, the attending physician, personally viewed and interpreted this ECG.   Date: 11/14/2016  EKG Time: 1947  Rate: 65  Rhythm: normal sinus rhythm  Axis: normal   Intervals:none  ST&T Change: Right bundle branch block with no ST elevation  ____________________________________________  RADIOLOGY  No results found.  ____________________________________________   PROCEDURES  Procedure(s) performed: None  Procedures  Critical Care performed: Yes ____________________________________________   INITIAL IMPRESSION / ASSESSMENT AND PLAN / ED COURSE  Pertinent labs & imaging results that were available during my care of the patient were reviewed by me and considered in my medical decision making (see chart for details).  75 y.o. male with recent TPA administration and thrombectomy to the SMA for acute on chronic occlusion presenting with epigastric and left upper quadrant abdominal pain associated with nausea vomiting and diarrhea. The patient may have symptomatic occlusion and I'll get a lactic acid for this as well as a CT scan. I also consider ileus or early partial small bowel obstruction. It is also possible that he may have a viral or foodborne GI illness. I'll treat the patient's symptoms, and await imaging for final disposition.  ____________________________________________  FINAL CLINICAL IMPRESSION(S) / ED DIAGNOSES  Final diagnoses:  None    Clinical Course as of Nov 14 2257  Tue Nov 14, 2016  2250 I received a call from the radiologist that the patient's CT scan demonstrates almost complete occlusion of his new SMA stent. I have called the vascular surgeon on-call for further information.  In addition, the patient's scan shows some inflammatory changes in the ascending colon, and the patient has had nausea vomiting and diarrhea, so will initiate him on antibiotics for possible infection.  [AN]    Clinical Course User Index [AN] Eula Listen, MD   ----------------------------------------- 10:59 PM on 11/14/2016 -----------------------------------------  I spoken with Dr. Delana Meyer, who recommends heparin drip, admission to  general medicine, and he will evaluate the patient in the morning.   NEW MEDICATIONS STARTED DURING THIS VISIT:  New Prescriptions   No medications on file      Eula Listen, MD 11/14/16 2259

## 2016-11-14 NOTE — ED Triage Notes (Signed)
Pt had same day surgery yesterday on his abd. States the pain is not where his incision is but in his stomach

## 2016-11-15 ENCOUNTER — Inpatient Hospital Stay: Payer: Medicare Other | Admitting: Registered Nurse

## 2016-11-15 ENCOUNTER — Encounter
Admission: EM | Disposition: A | Discharge status: Home / Self Care | Payer: Self-pay | Source: Home / Self Care | Attending: Vascular Surgery

## 2016-11-15 ENCOUNTER — Encounter: Payer: Self-pay | Admitting: *Deleted

## 2016-11-15 DIAGNOSIS — K55029 Acute infarction of small intestine, extent unspecified: Secondary | ICD-10-CM | POA: Diagnosis present

## 2016-11-15 DIAGNOSIS — Z87891 Personal history of nicotine dependence: Secondary | ICD-10-CM | POA: Diagnosis not present

## 2016-11-15 DIAGNOSIS — I7090 Unspecified atherosclerosis: Secondary | ICD-10-CM

## 2016-11-15 DIAGNOSIS — K551 Chronic vascular disorders of intestine: Secondary | ICD-10-CM | POA: Diagnosis present

## 2016-11-15 DIAGNOSIS — Z7982 Long term (current) use of aspirin: Secondary | ICD-10-CM | POA: Diagnosis not present

## 2016-11-15 DIAGNOSIS — Z885 Allergy status to narcotic agent status: Secondary | ICD-10-CM | POA: Diagnosis not present

## 2016-11-15 DIAGNOSIS — T82868A Thrombosis of vascular prosthetic devices, implants and grafts, initial encounter: Secondary | ICD-10-CM | POA: Diagnosis present

## 2016-11-15 DIAGNOSIS — R112 Nausea with vomiting, unspecified: Secondary | ICD-10-CM | POA: Diagnosis not present

## 2016-11-15 DIAGNOSIS — Z8249 Family history of ischemic heart disease and other diseases of the circulatory system: Secondary | ICD-10-CM | POA: Diagnosis not present

## 2016-11-15 DIAGNOSIS — I10 Essential (primary) hypertension: Secondary | ICD-10-CM | POA: Diagnosis present

## 2016-11-15 DIAGNOSIS — K559 Vascular disorder of intestine, unspecified: Secondary | ICD-10-CM

## 2016-11-15 DIAGNOSIS — Z808 Family history of malignant neoplasm of other organs or systems: Secondary | ICD-10-CM | POA: Diagnosis not present

## 2016-11-15 DIAGNOSIS — Z888 Allergy status to other drugs, medicaments and biological substances status: Secondary | ICD-10-CM | POA: Diagnosis not present

## 2016-11-15 DIAGNOSIS — R1010 Upper abdominal pain, unspecified: Secondary | ICD-10-CM

## 2016-11-15 DIAGNOSIS — K529 Noninfective gastroenteritis and colitis, unspecified: Secondary | ICD-10-CM | POA: Diagnosis present

## 2016-11-15 DIAGNOSIS — E785 Hyperlipidemia, unspecified: Secondary | ICD-10-CM | POA: Diagnosis present

## 2016-11-15 DIAGNOSIS — D6859 Other primary thrombophilia: Secondary | ICD-10-CM | POA: Diagnosis present

## 2016-11-15 DIAGNOSIS — Z85820 Personal history of malignant melanoma of skin: Secondary | ICD-10-CM | POA: Diagnosis not present

## 2016-11-15 DIAGNOSIS — I739 Peripheral vascular disease, unspecified: Secondary | ICD-10-CM | POA: Diagnosis present

## 2016-11-15 DIAGNOSIS — Z880 Allergy status to penicillin: Secondary | ICD-10-CM | POA: Diagnosis not present

## 2016-11-15 HISTORY — PX: PERIPHERAL VASCULAR CATHETERIZATION: SHX172C

## 2016-11-15 LAB — APTT
APTT: 72 s — AB (ref 24–36)
aPTT: 82 seconds — ABNORMAL HIGH (ref 24–36)

## 2016-11-15 LAB — CBC
HCT: 38.4 % — ABNORMAL LOW (ref 40.0–52.0)
HEMOGLOBIN: 12.9 g/dL — AB (ref 13.0–18.0)
MCH: 32 pg (ref 26.0–34.0)
MCHC: 33.6 g/dL (ref 32.0–36.0)
MCV: 95.1 fL (ref 80.0–100.0)
PLATELETS: 298 10*3/uL (ref 150–440)
RBC: 4.03 MIL/uL — AB (ref 4.40–5.90)
RDW: 14.4 % (ref 11.5–14.5)
WBC: 12.9 10*3/uL — AB (ref 3.8–10.6)

## 2016-11-15 LAB — BASIC METABOLIC PANEL
ANION GAP: 8 (ref 5–15)
BUN: 12 mg/dL (ref 6–20)
CALCIUM: 8.5 mg/dL — AB (ref 8.9–10.3)
CO2: 28 mmol/L (ref 22–32)
CREATININE: 0.69 mg/dL (ref 0.61–1.24)
Chloride: 100 mmol/L — ABNORMAL LOW (ref 101–111)
Glucose, Bld: 101 mg/dL — ABNORMAL HIGH (ref 65–99)
Potassium: 3.3 mmol/L — ABNORMAL LOW (ref 3.5–5.1)
SODIUM: 136 mmol/L (ref 135–145)

## 2016-11-15 LAB — HEPARIN LEVEL (UNFRACTIONATED)
HEPARIN UNFRACTIONATED: 0.46 [IU]/mL (ref 0.30–0.70)
Heparin Unfractionated: 0.23 IU/mL — ABNORMAL LOW (ref 0.30–0.70)

## 2016-11-15 LAB — SURGICAL PCR SCREEN
MRSA, PCR: NEGATIVE
STAPHYLOCOCCUS AUREUS: POSITIVE — AB

## 2016-11-15 SURGERY — AORTIC INTERVENTION
Anesthesia: General

## 2016-11-15 MED ORDER — ALTEPLASE 2 MG IJ SOLR
INTRAMUSCULAR | Status: DC | PRN
  Administered 2016-11-15: 6 mg

## 2016-11-15 MED ORDER — HEPARIN (PORCINE) IN NACL 100-0.45 UNIT/ML-% IJ SOLN
1050.0000 [IU]/h | INTRAMUSCULAR | Status: DC
  Administered 2016-11-15 – 2016-11-16 (×2): 1050 [IU]/h via INTRAVENOUS
  Filled 2016-11-15 (×2): qty 250

## 2016-11-15 MED ORDER — HEPARIN (PORCINE) IN NACL 2-0.9 UNIT/ML-% IJ SOLN
INTRAMUSCULAR | Status: AC
  Filled 2016-11-15: qty 1000

## 2016-11-15 MED ORDER — MIDAZOLAM HCL 2 MG/2ML IJ SOLN
INTRAMUSCULAR | Status: DC | PRN
  Administered 2016-11-15: 2 mg via INTRAVENOUS

## 2016-11-15 MED ORDER — SODIUM CHLORIDE 0.9 % IV SOLN
INTRAVENOUS | Status: DC | PRN
  Administered 2016-11-15: 25 ug/min via INTRAVENOUS

## 2016-11-15 MED ORDER — DEXAMETHASONE SODIUM PHOSPHATE 10 MG/ML IJ SOLN
INTRAMUSCULAR | Status: DC | PRN
  Administered 2016-11-15: 8 mg via INTRAVENOUS

## 2016-11-15 MED ORDER — HYDRALAZINE HCL 20 MG/ML IJ SOLN: 5.0000 mg | INTRAMUSCULAR | Status: DC | PRN

## 2016-11-15 MED ORDER — ONDANSETRON HCL 4 MG/2ML IJ SOLN
4.0000 mg | Freq: Four times a day (QID) | INTRAMUSCULAR | Status: DC | PRN
  Administered 2016-11-15: 4 mg via INTRAVENOUS

## 2016-11-15 MED ORDER — FENTANYL CITRATE (PF) 100 MCG/2ML IJ SOLN
25.0000 ug | INTRAMUSCULAR | Status: DC | PRN
  Administered 2016-11-15 (×3): 25 ug via INTRAVENOUS

## 2016-11-15 MED ORDER — FENTANYL CITRATE (PF) 100 MCG/2ML IJ SOLN
INTRAMUSCULAR | Status: DC | PRN
  Administered 2016-11-15 (×3): 50 ug via INTRAVENOUS

## 2016-11-15 MED ORDER — HEPARIN SODIUM (PORCINE) 1000 UNIT/ML IJ SOLN: INTRAMUSCULAR | Status: DC | PRN

## 2016-11-15 MED ORDER — MUPIROCIN 2 % EX OINT
TOPICAL_OINTMENT | Freq: Two times a day (BID) | CUTANEOUS | Status: DC
  Administered 2016-11-15 – 2016-11-16 (×3): via NASAL
  Filled 2016-11-15: qty 22

## 2016-11-15 MED ORDER — ALTEPLASE 2 MG IJ SOLR
INTRAMUSCULAR | Status: AC
  Filled 2016-11-15: qty 6

## 2016-11-15 MED ORDER — LIDOCAINE HCL (PF) 1 % IJ SOLN
INTRAMUSCULAR | Status: AC
  Filled 2016-11-15: qty 30

## 2016-11-15 MED ORDER — SUGAMMADEX SODIUM 500 MG/5ML IV SOLN
INTRAVENOUS | Status: DC | PRN
  Administered 2016-11-15: 254 mg via INTRAVENOUS

## 2016-11-15 MED ORDER — ROCURONIUM BROMIDE 100 MG/10ML IV SOLN
INTRAVENOUS | Status: DC | PRN
  Administered 2016-11-15 (×2): 10 mg via INTRAVENOUS
  Administered 2016-11-15: 20 mg via INTRAVENOUS
  Administered 2016-11-15: 40 mg via INTRAVENOUS
  Administered 2016-11-15: 20 mg via INTRAVENOUS

## 2016-11-15 MED ORDER — METOPROLOL TARTRATE 5 MG/5ML IV SOLN
5.0000 mg | Freq: Four times a day (QID) | INTRAVENOUS | Status: DC
  Administered 2016-11-15 – 2016-11-16 (×6): 5 mg via INTRAVENOUS
  Filled 2016-11-15 (×5): qty 5

## 2016-11-15 MED ORDER — PROPOFOL 10 MG/ML IV BOLUS
INTRAVENOUS | Status: DC | PRN
  Administered 2016-11-15: 50 mg via INTRAVENOUS

## 2016-11-15 MED ORDER — SUCCINYLCHOLINE CHLORIDE 20 MG/ML IJ SOLN
INTRAMUSCULAR | Status: DC | PRN
  Administered 2016-11-15: 80 mg via INTRAVENOUS

## 2016-11-15 MED ORDER — SODIUM CHLORIDE 0.9 % IV SOLN
INTRAVENOUS | Status: DC
  Administered 2016-11-15 – 2016-11-16 (×6): via INTRAVENOUS

## 2016-11-15 MED ORDER — LABETALOL HCL 5 MG/ML IV SOLN: 10.0000 mg | INTRAVENOUS | Status: DC | PRN

## 2016-11-15 MED ORDER — LIDOCAINE HCL (CARDIAC) 20 MG/ML IV SOLN
INTRAVENOUS | Status: DC | PRN
  Administered 2016-11-15: 100 mg via INTRAVENOUS

## 2016-11-15 MED ORDER — ALUM & MAG HYDROXIDE-SIMETH 200-200-20 MG/5ML PO SUSP: 15.0000 mL | ORAL | Status: DC | PRN

## 2016-11-15 MED ORDER — METOPROLOL TARTRATE 5 MG/5ML IV SOLN
INTRAVENOUS | Status: AC
  Filled 2016-11-15: qty 5

## 2016-11-15 MED ORDER — HYDROMORPHONE HCL 1 MG/ML IJ SOLN
0.5000 mg | INTRAMUSCULAR | Status: DC | PRN
  Administered 2016-11-15 (×4): 1 mg via INTRAVENOUS
  Filled 2016-11-15 (×4): qty 1

## 2016-11-15 MED ORDER — ONDANSETRON HCL 4 MG/2ML IJ SOLN: 4.0000 mg | Freq: Once | INTRAMUSCULAR | Status: DC | PRN

## 2016-11-15 MED ORDER — PANTOPRAZOLE SODIUM 40 MG IV SOLR
40.0000 mg | Freq: Every day | INTRAVENOUS | Status: DC
  Administered 2016-11-15 (×2): 40 mg via INTRAVENOUS
  Filled 2016-11-15 (×2): qty 40

## 2016-11-15 SURGICAL SUPPLY — 28 items
BALLN ARMADA 9X40X135 (BALLOONS) ×3 IMPLANT
CATH BERNSTEIN 5FR 130CM (CATHETERS) ×3 IMPLANT
CATH CXI SUPP ANG 4FR 135 (MICROCATHETER) ×1 IMPLANT
CATH CXI SUPP ANG 4FR 135CM (MICROCATHETER) ×3
CATH INFINITI 5 FR MPA2 (CATHETERS) ×3 IMPLANT
CATH PIG 70CM (CATHETERS) ×3 IMPLANT
CATH SIZING 5F 100CM .035 PIG (CATHETERS) ×3 IMPLANT
CATH STS 5FR 125CM (CATHETERS) ×3 IMPLANT
COVER DRAPE FLUORO 36X44 (DRAPES) ×9 IMPLANT
DEVICE PRESTO INFLATION (MISCELLANEOUS) ×3 IMPLANT
DEVICE SOLENT OMNI 120CM (CATHETERS) ×3 IMPLANT
DEVICE TORQUE (MISCELLANEOUS) ×3 IMPLANT
GLIDECATH ANGLED 4FR 120CM (CATHETERS) ×3 IMPLANT
GLIDEWIRE ADV .035X260CM (WIRE) ×3 IMPLANT
GLIDEWIRE ANGLED SS 035X260CM (WIRE) ×3 IMPLANT
GUIDEWIRE PFTE-COATED .018X300 (WIRE) ×3 IMPLANT
NEEDLE ENTRY 21GA 7CM ECHOTIP (NEEDLE) ×3 IMPLANT
PACK ANGIOGRAPHY (CUSTOM PROCEDURE TRAY) ×3 IMPLANT
SET INTRO CAPELLA COAXIAL (SET/KITS/TRAYS/PACK) ×3 IMPLANT
SHEATH BRITE TIP 5FRX11 (SHEATH) ×3 IMPLANT
SHEATH FLEXORE 7FRX80 (SHEATH) ×3 IMPLANT
SHIELD RADPAD SCOOP 12X17 (MISCELLANEOUS) ×6 IMPLANT
STENT LIFESTREAM 8X26X135 (Permanent Stent) ×3 IMPLANT
SYR MEDRAD MARK V 150ML (SYRINGE) ×3 IMPLANT
TOWEL OR 17X26 4PK STRL BLUE (TOWEL DISPOSABLE) ×6 IMPLANT
TUBING CONTRAST HIGH PRESS 72 (TUBING) ×3 IMPLANT
WIRE HI TORQ VERSACORE 300 (WIRE) ×3 IMPLANT
WIRE J 3MM .035X145CM (WIRE) ×3 IMPLANT

## 2016-11-15 NOTE — Anesthesia Preprocedure Evaluation (Addendum)
Anesthesia Evaluation  Patient identified by MRN, date of birth, ID band Patient awake    Reviewed: Allergy & Precautions, NPO status , Patient's Chart, lab work & pertinent test results, reviewed documented beta blocker date and time   Airway Mallampati: II  TM Distance: >3 FB     Dental  (+) Chipped   Pulmonary former smoker,           Cardiovascular hypertension, Pt. on medications and Pt. on home beta blockers + Peripheral Vascular Disease       Neuro/Psych    GI/Hepatic   Endo/Other    Renal/GU      Musculoskeletal   Abdominal   Peds  Hematology  (+) anemia ,   Anesthesia Other Findings Mesenteric ischemia. [facility].  Reproductive/Obstetrics                            Anesthesia Physical Anesthesia Plan  ASA: III  Anesthesia Plan: General   Post-op Pain Management:    Induction: Intravenous  Airway Management Planned: Oral ETT  Additional Equipment:   Intra-op Plan:   Post-operative Plan:   Informed Consent: I have reviewed the patients History and Physical, chart, labs and discussed the procedure including the risks, benefits and alternatives for the proposed anesthesia with the patient or authorized representative who has indicated his/her understanding and acceptance.     Plan Discussed with: CRNA  Anesthesia Plan Comments:         Anesthesia Quick Evaluation

## 2016-11-15 NOTE — Anesthesia Procedure Notes (Signed)
Procedure Name: Intubation Date/Time: 11/15/2016 4:37 PM Performed by: Nelda Marseille Pre-anesthesia Checklist: Patient identified, Patient being monitored, Timeout performed, Emergency Drugs available and Suction available Patient Re-evaluated:Patient Re-evaluated prior to inductionOxygen Delivery Method: Circle system utilized Preoxygenation: Pre-oxygenation with 100% oxygen Intubation Type: IV induction Ventilation: Mask ventilation without difficulty Laryngoscope Size: Mac and 3 Grade View: Grade II Tube type: Oral Tube size: 7.5 mm Number of attempts: 1 Airway Equipment and Method: Stylet Placement Confirmation: ETT inserted through vocal cords under direct vision,  positive ETCO2 and breath sounds checked- equal and bilateral Secured at: 21 cm Tube secured with: Tape Dental Injury: Teeth and Oropharynx as per pre-operative assessment

## 2016-11-15 NOTE — H&P (Signed)
[facility] Admission History & Physical  MRN : [id number]  [name] is a 75 y.o. (11-14-1941) male who presents with chief complaint of  Chief Complaint  Patient presents with  . Abdominal Pain  .  History of Present Illness: Mr. Cucchi presented to the emergency room late yesterday night with several episodes of nausea vomiting some bloody diarrhea and return of his abdominal pain. He noted these are the same symptoms that had occurred several weeks ago. He did undergo thrombectomy of a superior mesenteric stent on Monday approximately 24 hours prior to his return. He noted the symptoms are the same. He has not been eating. He attests to significant postprandial pain.  CT scan ordered by the emergency room physician demonstrated thrombus within the stent. I have personally reviewed the study from Monday and the final images are excellent do not see any abnormality that would've caused such rapid thrombosis.  Patient states he has been taking his Xarelto.  Current Facility-Administered Medications  Medication Dose Route Frequency Provider Last Rate Last Dose  . 0.9 %  sodium chloride infusion   Intravenous Continuous Katha Cabal, MD 100 mL/hr at 11/15/16 1349    . [MAR Hold] alum & mag hydroxide-simeth (MAALOX/MYLANTA) 200-200-20 MG/5ML suspension 15-30 mL  15-30 mL Oral Q2H PRN Katha Cabal, MD      . heparin ADULT infusion 100 units/mL (25000 units/286mL sodium chloride 0.45%)  1,050 Units/hr Intravenous Continuous Katha Cabal, MD 10.5 mL/hr at 11/15/16 0244 1,050 Units/hr at 11/15/16 0244  . [MAR Hold] hydrALAZINE (APRESOLINE) injection 5 mg  5 mg Intravenous Q20 Min PRN Katha Cabal, MD      . Doug Sou Hold] HYDROmorphone (DILAUDID) injection 0.5-1 mg  0.5-1 mg Intravenous Q2H PRN Katha Cabal, MD   1 mg at 11/15/16 1349  . [MAR Hold] labetalol (NORMODYNE,TRANDATE) injection 10 mg  10 mg Intravenous Q10 min PRN Katha Cabal, MD       . Doug Sou Hold] metoprolol (LOPRESSOR) injection 5 mg  5 mg Intravenous Q6H Katha Cabal, MD   5 mg at 11/15/16 1236  . [MAR Hold] mupirocin ointment (BACTROBAN) 2 %   Nasal BID Katha Cabal, MD      . Doug Sou Hold] ondansetron [facility]) injection 4 mg  4 mg Intravenous Q6H PRN Katha Cabal, MD      . Doug Sou Hold] pantoprazole (PROTONIX) injection 40 mg  40 mg Intravenous QHS Katha Cabal, MD   40 mg at 11/15/16 0244    Past Medical History:  Diagnosis Date  . HLD (hyperlipidemia)   . Hypertension   . PVD (peripheral vascular disease) ([place])   . Vomiting    and abd pain    Past Surgical History:  Procedure Laterality Date  . PERIPHERAL VASCULAR CATHETERIZATION N/A 06/15/2015   Procedure: Abdominal Aortogram w/Lower Extremity;  Surgeon: Katha Cabal, MD;  Location: [facility];  Service: Cardiovascular;  Laterality: N/A;  . PERIPHERAL VASCULAR CATHETERIZATION  06/15/2015   Procedure: Lower Extremity Intervention;  Surgeon: Katha Cabal, MD;  Location: [place] [facility];  Service: Cardiovascular;;  . PERIPHERAL VASCULAR CATHETERIZATION N/A 08/25/2016   Procedure: Abdominal Aortogram;  Surgeon: Algernon Huxley, MD;  Location: [facility];  Service: Cardiovascular;  Laterality: N/A;  . PERIPHERAL VASCULAR CATHETERIZATION N/A 08/25/2016   Procedure: Visceral Artery Intervention;  Surgeon: Algernon Huxley, MD;  Location: [facility];  Service: Cardiovascular;  Laterality: N/A;  .  PERIPHERAL VASCULAR CATHETERIZATION N/A 11/13/2016   Procedure: Visceral Angiography;  Surgeon: Algernon Huxley, MD;  Location: [place] [place] [facility];  Service: Cardiovascular;  Laterality: N/A;  . TONSILLECTOMY      Social History Social History  Substance Use Topics  . Smoking status: Former Smoker    Types: E-cigarettes  . Smokeless tobacco: Former Systems developer  . Alcohol use 1.2 oz/week    2 Cans of beer per week    Family History Family History  Problem Relation Age of  Onset  . Heart attack Mother   . Heart attack Father   . Basal cell carcinoma Father   No family history of bleeding clotting disorders, porphyria or autoimmune disease.  Allergies  Allergen Reactions  . Eliquis [Apixaban] Other (See Comments)    causes nose bleeds  . Oxycodone-Acetaminophen Other (See Comments)    hallucinations  . Penicillins Rash    Childhood allergy     REVIEW OF SYSTEMS (Negative unless checked)  Constitutional: [] Weight loss  [] Fever  [] Chills Cardiac: [] Chest pain   [] Chest pressure   [] Palpitations   [] Shortness of breath when laying flat   [] Shortness of breath at rest   [] Shortness of breath with exertion. Vascular:  [x] Pain in legs with walking   [] Pain in legs at rest   [] Pain in legs when laying flat   [] Claudication   [] Pain in feet when walking  [] Pain in feet at rest  [] Pain in feet when laying flat   [] History of DVT   [] Phlebitis   [] Swelling in legs   [] Varicose veins   [] Non-healing ulcers Pulmonary:   [] Uses home oxygen   [] Productive cough   [] Hemoptysis   [] Wheeze  [] COPD   [] Asthma Neurologic:  [] Dizziness  [] Blackouts   [] Seizures   [] History of stroke   [] History of TIA  [] Aphasia   [] Temporary blindness   [] Dysphagia   [] Weakness or numbness in arms   [] Weakness or numbness in legs Musculoskeletal:  [] Arthritis   [] Joint swelling   [] Joint pain   [] Low back pain Hematologic:  [] Easy bruising  [] Easy bleeding   [] Hypercoagulable state   [] Anemic  [] Hepatitis Gastrointestinal:  [x] Blood in stool   [x] Vomiting blood  [] Gastroesophageal reflux/heartburn   [] Difficulty swallowing. Genitourinary:  [] Chronic kidney disease   [] Difficult urination  [] Frequent urination  [] Burning with urination   [] Blood in urine Skin:  [] Rashes   [] Ulcers   [] Wounds Psychological:  [] History of anxiety   []  History of major depression.  Physical Examination  Vitals:   11/15/16 0153 11/15/16 0516 11/15/16 1218 11/15/16 1447  BP: (!) 153/68 (!) 146/66 (!) 154/76  (!) 141/78  Pulse: 66 73 77 81  Resp: 18 16 18 20   Temp: 98 F (36.7 C) 97.8 F (36.6 C) 98.1 F (36.7 C) 98.1 F (36.7 C)  TempSrc: Oral Oral Oral Oral  SpO2: 96% 92% 95% 95%  Weight:    63.5 kg (140 lb)  Height:    5\' 7"  (1.702 m)   Body mass index is 21.93 kg/m. Gen: WD/WN, NAD Head: [place]/AT, No temporalis wasting. Prominent temp pulse not noted. Ear/Nose/Throat: Hearing grossly intact, nares w/o erythema or drainage, oropharynx w/o Erythema/Exudate,  Eyes: Conjunctiva clear, sclera non-icteric Neck: Trachea midline.  No JVD.  Pulmonary:  Good air movement, respirations not labored, no use of accessory muscles.  Cardiac: RRR, normal S1, S2. Vascular:  Vessel Right Left  Radial Palpable Palpable  Ulnar Palpable Palpable  Brachial Palpable Palpable  Carotid Palpable, without bruit Palpable,  without bruit  Aorta Not palpable N/A  Femoral Palpable Palpable  Popliteal Not Palpable Not Palpable  PT Not Palpable Not Palpable  DP Not Palpable Not Palpable   Gastrointestinal: soft, Mild tender/non-distended. No guarding/reflex.  Musculoskeletal: M/S 5/5 throughout.  Extremities without ischemic changes.  No deformity or atrophy.  Neurologic: Sensation grossly intact in extremities.  Symmetrical.  Speech is fluent. Motor exam as listed above. Psychiatric: Judgment intact, Mood & affect appropriate for pt's clinical situation. Dermatologic: No rashes or ulcers noted.  No cellulitis or open wounds. Lymph : No Cervical, Axillary, or Inguinal lymphadenopathy.     CBC Lab Results  Component Value Date   WBC 12.9 (H) 11/15/2016   HGB 12.9 (L) 11/15/2016   HCT 38.4 (L) 11/15/2016   MCV 95.1 11/15/2016   PLT 298 11/15/2016    BMET    Component Value Date/Time   NA 136 11/15/2016 0846   NA 137 03/22/2013 0428   K 3.3 (L) 11/15/2016 0846   K 3.8 03/22/2013 0428   CL 100 (L) 11/15/2016 0846   CL 102 03/22/2013 0428   CO2 28 11/15/2016 0846   CO2 32 03/22/2013 0428    GLUCOSE 101 (H) 11/15/2016 0846   GLUCOSE 99 03/22/2013 0428   BUN 12 11/15/2016 0846   BUN 15 03/22/2013 0428   CREATININE 0.69 11/15/2016 0846   CREATININE 1.00 03/22/2013 0428   CALCIUM 8.5 (L) 11/15/2016 0846   CALCIUM 8.4 (L) 03/22/2013 0428   GFRNONAA >60 11/15/2016 0846   GFRNONAA >60 03/22/2013 0428   GFRAA >60 11/15/2016 0846   GFRAA >60 03/22/2013 0428   Estimated Creatinine Clearance: 71.7 mL/min (by C-G formula based on SCr of 0.69 mg/dL).  COAG Lab Results  Component Value Date   INR 1.11 11/14/2016   INR 1.05 08/24/2016    Radiology Ct Abdomen Pelvis W Contrast  Result Date: 11/14/2016 CLINICAL DATA:  Acute onset of generalized abdominal pain. Recent abdominal vascular surgery. Initial encounter. EXAM: CT ABDOMEN AND PELVIS WITH CONTRAST TECHNIQUE: Multidetector CT imaging of the abdomen and pelvis was performed using the standard protocol following bolus administration of intravenous contrast. CONTRAST:  118mL ISOVUE-300 IOPAMIDOL (ISOVUE-300) INJECTION 61% COMPARISON:  CT of the abdomen and pelvis from 08/24/2016 FINDINGS: Lower chest: Mild scarring is noted at the right lung base. Scattered coronary artery calcifications are seen. Hepatobiliary: The liver is unremarkable in appearance. The gallbladder is unremarkable in appearance. The common bile duct remains normal in caliber. Pancreas: The pancreas is within normal limits. Spleen: The spleen is unremarkable in appearance. Adrenals/Urinary Tract: The adrenal glands are unremarkable in appearance. Mild bilateral renal scarring is noted. Mild perinephric stranding is noted bilaterally. The kidneys are within normal limits. There is no evidence of hydronephrosis. No renal or ureteral stones are identified. Stomach/Bowel: The stomach is partially filled with contrast and is unremarkable in appearance. The appendix is normal in caliber, without evidence of appendicitis. There is mild wall thickening along the cecum and  proximal ascending colon, raising question for a mild infectious or inflammatory process. The colon is largely decompressed. Scattered diverticulosis is noted along the descending and sigmoid colon, without evidence of diverticulitis. Visualized small bowel loops are unremarkable in appearance. Vascular/Lymphatic: Diffuse calcification is seen along the abdominal aorta and its branches. There is near complete occlusion of the superior mesenteric artery at and distal to the patient's superior mesenteric artery stent secondary to underlying mural thrombus, measuring approximately 4 cm in length, with reconstitution noted distally. Dense calcification is noted  at the origins of the renal arteries bilaterally. The inferior vena cava is grossly unremarkable. No retroperitoneal lymphadenopathy is seen. No pelvic sidewall lymphadenopathy is identified. Reproductive: The bladder is mildly distended and grossly unremarkable. The prostate is normal in size, with scattered calcification. Other: No additional soft tissue abnormalities are seen. Musculoskeletal: No acute osseous abnormalities are identified. 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 Vacuum phenomenon is noted along the lumbar spine. There is mild chronic loss of height at vertebral body T12. The visualized musculature is unremarkable in appearance. IMPRESSION: 1. Mild wall thickening along the cecum and proximal ascending colon, raising question for a mild infectious or inflammatory process. 2. Near complete occlusion of the superior mesenteric artery at and distal to the patient's superior mesenteric artery stent, secondary to underlying mural thrombus, measuring approximately 4 cm in length, with reconstitution noted distally. The degree of thrombosis is more prominent than on the prior study. 3. Scattered diverticulosis along the descending and sigmoid colon, without evidence of diverticulitis. 4. Diffuse  aortic atherosclerosis. Dense calcification at the origins of the renal arteries bilaterally. 5. Mild scarring at the right lung base. 6. Scattered coronary artery calcifications seen. 7. Mild bilateral renal scarring noted. 8. 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 These results were called by telephone at the time of interpretation on 11/14/2016 at 10:47 pm to Dr. Eula Listen, who verbally acknowledged these results. Electronically Signed   By: Garald Balding M.D.   On: 11/14/2016 22:49    Assessment/Plan 1. Recurrent mesenteric ischemia:  As noted above I have reviewed both the CT scan from yesterday as well as the angiogram from Monday. I do agree there appears to be thrombus within the stent on CT. On review of the interventional angiogram I do not see any obvious abnormality that would have explained such rapid rethrombosis. He is having continuous pain which at least at this point is controlled with Dilaudid. His white count is only mildly elevated and his electrolytes all remained acceptable. I will move forward with angiography likely I will plan to add an additional stent and try to hyperexpansion this area. I will also approach this from the arm as this different angle may provide for a more successful intervention. The risks and benefits of been reviewed all questions of been answered the patient agrees to proceed. He understands that he is at significant risk for loss of life secondary to mesenteric ischemia and gangrene to the bowels. 2. Complication of vascular device: See above for plan 3. Atherosclerotic occlusive disease bilateral lower extremities: Based on the CT scan his aorta iliac disease would be extremely difficult to reconstruct given the dense calcifications there would be great problems obtaining vascular control. I believe continuing to treat his peripheral arterial disease with interventional techniques is far away his best  option. 4.  Hypertension:  Continue his antihypertensive medications 5.  Possible hypercoagulable state: Patient has been seen by hematology in the past I will set up a follow-up with them either during his admission or as an outpatient.   Hortencia Pilar, MD  11/15/2016 3:38 PM

## 2016-11-15 NOTE — Plan of Care (Signed)
Problem: Fluid Volume: Goal: Ability to maintain a balanced intake and output will improve Outcome: Not Progressing Patient is NPO.  Problem: Nutrition: Goal: Adequate nutrition will be maintained Outcome: Not Progressing Patient is NPO.   

## 2016-11-15 NOTE — Transfer of Care (Signed)
Immediate Anesthesia Transfer of Care Note  Patient: [name]  Procedure(s) Performed: Procedure(s): Aortic Intervention (N/A) Visceral Angiography (N/A) Visceral Artery Intervention (N/A)  Patient Location: PACU  Anesthesia Type:General  Level of Consciousness: awake, alert  and patient cooperative  Airway & Oxygen Therapy: Patient Spontanous Breathing and Patient connected to face mask oxygen  Post-op Assessment: Report given to RN and Post -op Vital signs reviewed and stable  Post vital signs: Reviewed and stable  Last Vitals:  Vitals:   11/15/16 1218 11/15/16 1447  BP: (!) 154/76 (!) 141/78  Pulse: 77 81  Resp: 18 20  Temp: 36.7 C 36.7 C    Last Pain:  Vitals:   11/15/16 1447  TempSrc: Oral  PainSc:          Complications: No apparent anesthesia complications

## 2016-11-15 NOTE — Progress Notes (Signed)
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 Per weight encounters, Pt reports he has been eating 3 meals per day; he always eats 2 eggs with bacon for breakfast, maybe a sandwich or oatmeal for lunch and a small piece of steak/meat for dinner with no sides. Pt just bought some Ensure, drank 2 prior to admission but has not been drinking regularly  Nutrition-Focused physical exam completed. Findings are mild fat depletion, mild muscle depletion, and no edema.   Labs: potassium 3.1 Meds: NS at 100 ml/hr, potassium chloride  Diet Order:  Diet NPO time specified  Skin:  Reviewed, no issues  Last BM:  11/28  Height:   Ht Readings from Last 1 Encounters:  11/14/16 5\' 7"  (1.702 m)    Weight:   Wt Readings from Last 1 Encounters:  11/14/16 140 lb (63.5 kg)    Wt Readings from Last 10 Encounters:  11/14/16 140 lb (63.5 kg)  11/13/16 140 lb (63.5 kg)  11/08/16 140 lb (63.5 kg)  11/07/16 140 lb (63.5 kg)  10/02/16 143 lb 15.4 oz (65.3 kg)  09/19/16 144 lb  (65.3 kg)  08/27/16 144 lb 14.4 oz (65.7 kg)  08/24/16 140 lb 1.6 oz (63.6 kg)  07/14/16 149 lb 4 oz (67.7 kg)  06/15/15 155 lb (70.3 kg)    BMI:  Body mass index is 21.93 kg/m.  Estimated Nutritional Needs:   Kcal:  [id number] kcals  Protein:  95-110 g  Fluid:  >/= 2 L  EDUCATION NEEDS:   No education needs identified at this time  [place], [place], LDN [phone] Pager  [phone] Weekend/On-Call Pager

## 2016-11-15 NOTE — Progress Notes (Signed)
ANTICOAGULATION CONSULT NOTE - FOLLOW UP   Pharmacy Consult for heparin drip Indication: thrombosis of arterial stent  Allergies  Allergen Reactions  . Eliquis [Apixaban] Other (See Comments)    causes nose bleeds  . Oxycodone-Acetaminophen Other (See Comments)    hallucinations  . Penicillins Rash    Childhood allergy    Patient Measurements: Height: 5\' 7"  (170.2 cm) Weight: 140 lb (63.5 kg) IBW/kg (Calculated) : 66.1 Heparin Dosing Weight: 64kg  Vital Signs: Temp: 97.8 F (36.6 C) (11/29 0516) Temp Source: Oral (11/29 0516) BP: 146/66 (11/29 0516) Pulse Rate: 73 (11/29 0516)  Labs:  Recent Labs  11/14/16 1946 11/15/16 0846  HGB 14.5 12.9*  HCT 41.9 38.4*  PLT 329 298  APTT 46* 82*  LABPROT 14.4  --   INR 1.11  --   HEPARINUNFRC  --  0.46  CREATININE 0.78 0.69    Estimated Creatinine Clearance: 71.7 mL/min (by C-G formula based on SCr of 0.69 mg/dL).   Medical History: Past Medical History:  Diagnosis Date  . HLD (hyperlipidemia)   . Hypertension   . PVD (peripheral vascular disease) ([place])   . Vomiting    and abd pain    Medications:  On Xarelto as outpatient   Assessment:  Goal of Therapy:  Heparin level 0.3-0.7 units/ml Monitor platelets by anticoagulation protocol: Yes   Plan:  Heparin level and APTT within goal ranges; therefore will continue heparin gtt at current rate. Will recheck APTT and Heparin level again @ 1500. If levels within normal limits, will start dosing based off of heparin levels. Pharmacy to follow.   Nayleen Janosik D 11/15/2016,11:02 AM

## 2016-11-15 NOTE — Progress Notes (Signed)
ANTICOAGULATION CONSULT NOTE - Initial Consult  Pharmacy Consult for heparin drip Indication: thrombosis of arterial stent  Allergies  Allergen Reactions  . Eliquis [Apixaban] Other (See Comments)    causes nose bleeds  . Oxycodone-Acetaminophen Other (See Comments)    hallucinations  . Penicillins Rash    Childhood allergy    Patient Measurements: Height: 5\' 7"  (170.2 cm) Weight: 140 lb (63.5 kg) IBW/kg (Calculated) : 66.1 Heparin Dosing Weight: 64kg  Vital Signs: Temp: 98 F (36.7 C) (11/29 0153) Temp Source: Oral (11/29 0153) BP: 153/68 (11/29 0153) Pulse Rate: 66 (11/29 0153)  Labs:  Recent Labs  11/14/16 1946  HGB 14.5  HCT 41.9  PLT 329  APTT 46*  LABPROT 14.4  INR 1.11  CREATININE 0.78    Estimated Creatinine Clearance: 71.7 mL/min (by C-G formula based on SCr of 0.78 mg/dL).   Medical History: Past Medical History:  Diagnosis Date  . HLD (hyperlipidemia)   . Hypertension   . PVD (peripheral vascular disease) ([place])   . Vomiting    and abd pain    Medications:  On Xarelto as outpatient   Assessment:  Goal of Therapy:  Heparin level 0.3-0.7 units/ml Monitor platelets by anticoagulation protocol: Yes   Plan:  Will start heparin at 1050 units/hr. Heparin started in ED with no bolus, so will not bolus now. Baseline anti-Xa not ordered before heparin started, but since patient has Xarelto in home medications will follow by aPTT and heparin level initially. First levels 6 hours after start of 1050 units/hr infusion.   Denia Mcvicar S 11/15/2016,2:04 AM

## 2016-11-15 NOTE — Progress Notes (Addendum)
ANTICOAGULATION CONSULT NOTE - FOLLOW UP   Pharmacy Consult for heparin drip Indication: thrombosis of arterial stent  Allergies  Allergen Reactions  . Eliquis [Apixaban] Other (See Comments)    causes nose bleeds  . Oxycodone-Acetaminophen Other (See Comments)    hallucinations  . Penicillins Rash    Childhood allergy    Patient Measurements: Height: 5\' 7"  (170.2 cm) Weight: 140 lb (63.5 kg) IBW/kg (Calculated) : 66.1 Heparin Dosing Weight: 64kg  Vital Signs: Temp: 98.1 F (36.7 C) (11/29 1845) Temp Source: Oral (11/29 1447) BP: 164/88 (11/29 2101) Pulse Rate: 85 (11/29 2101)  Labs:  Recent Labs  11/14/16 1946 11/15/16 0846  HGB 14.5 12.9*  HCT 41.9 38.4*  PLT 329 298  APTT 46* 82*  LABPROT 14.4  --   INR 1.11  --   HEPARINUNFRC  --  0.46  CREATININE 0.78 0.69    Estimated Creatinine Clearance: 71.7 mL/min (by C-G formula based on SCr of 0.69 mg/dL).   Medical History: Past Medical History:  Diagnosis Date  . HLD (hyperlipidemia)   . Hypertension   . PVD (peripheral vascular disease) ([place])   . Vomiting    and abd pain    Medications:  On Xarelto as outpatient   Assessment:  Goal of Therapy:  Heparin level 0.3-0.7 units/ml Monitor platelets by anticoagulation protocol: Yes   Plan:  Heparin level and APTT within goal ranges; therefore will continue heparin gtt at current rate. Will recheck APTT and Heparin level again @ 1500. If levels within normal limits, will start dosing based off of heparin levels. Pharmacy to follow.   11/29:  Heparin gtt restarted @ 21:00 at previous rate per Dr Delana Meyer.  Will recheck HL on 11/30 @ 0500.   11/30 AM heparin level 0.39. Continue current regimen.  Recheck in 8 hours to confirm.  [name],[name] 11/15/2016,9:09 PM

## 2016-11-16 ENCOUNTER — Encounter: Payer: Self-pay | Admitting: Vascular Surgery

## 2016-11-16 DIAGNOSIS — K559 Vascular disorder of intestine, unspecified: Secondary | ICD-10-CM

## 2016-11-16 LAB — CBC
HEMATOCRIT: 36.1 % — AB (ref 40.0–52.0)
Hemoglobin: 12.4 g/dL — ABNORMAL LOW (ref 13.0–18.0)
MCH: 32.6 pg (ref 26.0–34.0)
MCHC: 34.3 g/dL (ref 32.0–36.0)
MCV: 94.9 fL (ref 80.0–100.0)
PLATELETS: 289 10*3/uL (ref 150–440)
RBC: 3.8 MIL/uL — ABNORMAL LOW (ref 4.40–5.90)
RDW: 14.3 % (ref 11.5–14.5)
WBC: 11.5 10*3/uL — ABNORMAL HIGH (ref 3.8–10.6)

## 2016-11-16 LAB — HEPARIN LEVEL (UNFRACTIONATED)
HEPARIN UNFRACTIONATED: 0.39 [IU]/mL (ref 0.30–0.70)
HEPARIN UNFRACTIONATED: 0.4 [IU]/mL (ref 0.30–0.70)

## 2016-11-16 MED ORDER — APIXABAN 5 MG PO TABS
10.0000 mg | ORAL_TABLET | Freq: Two times a day (BID) | ORAL | Status: DC
  Administered 2016-11-16: 10 mg via ORAL
  Filled 2016-11-16: qty 2

## 2016-11-16 MED ORDER — ENSURE ENLIVE PO LIQD: 237.0000 mL | Freq: Two times a day (BID) | ORAL | Status: DC

## 2016-11-16 MED ORDER — ADULT MULTIVITAMIN W/MINERALS CH
1.0000 | ORAL_TABLET | Freq: Every day | ORAL | Status: DC
  Administered 2016-11-16: 1 via ORAL
  Filled 2016-11-16: qty 1

## 2016-11-16 MED ORDER — APIXABAN 5 MG PO TABS: 10.0000 mg | ORAL_TABLET | Freq: Two times a day (BID) | ORAL | 6 refills | Status: DC

## 2016-11-16 NOTE — Discharge Summary (Signed)
[facility]    Discharge Summary    Patient ID:  [name] MRN: [id number] DOB/AGE: 75-Nov-1942 75 y.o.  Admit date: 11/14/2016 Discharge date: 11/16/2016 Date of Surgery: 11/14/2016 - 11/15/2016 Surgeon: Juliann Mule): Katha Cabal, MD  Admission Diagnosis: Colitis [K52.9] Artery occlusion [facility]) [I74.9]  Discharge Diagnoses:  Colitis [K52.9] Artery occlusion ([place]) [I74.9]  Secondary Diagnoses: Past Medical History:  Diagnosis Date  . HLD (hyperlipidemia)   . Hypertension   . PVD (peripheral vascular disease) ([place])   . Vomiting    and abd pain    Procedure(s): Aortic Intervention Visceral Angiography Visceral Artery Intervention  Discharged Condition: good  HPI:  Patient underwent angiography yesterday with thrombectomy of a previously placed stent and subsequently re-intervention. His abdominal pain has now resolved. He is tolerated several meals. He is fit for discharge  Hospital Course:  [name] is a 75 y.o. male is S/P Neither Procedure(s): Aortic Intervention Visceral Angiography Visceral Artery Intervention Extubated: POD # 0 Physical exam: Abdomen soft nontender right hand pink and warm Post-op wounds clean, dry, intact or healing well Pt. Ambulating, voiding and taking PO diet without difficulty. Pt pain controlled with PO pain meds. Labs as below Complications:none  Consults:    Significant Diagnostic Studies: CBC Lab Results  Component Value Date   WBC 11.5 (H) 11/16/2016   HGB 12.4 (L) 11/16/2016   HCT 36.1 (L) 11/16/2016   MCV 94.9 11/16/2016   PLT 289 11/16/2016    BMET    Component Value Date/Time   NA 136 11/15/2016 0846   NA 137 03/22/2013 0428   K 3.3 (L) 11/15/2016 0846   K 3.8 03/22/2013 0428   CL 100 (L) 11/15/2016 0846   CL 102 03/22/2013 0428   CO2 28 11/15/2016 0846   CO2 32 03/22/2013 0428   GLUCOSE 101 (H) 11/15/2016 0846   GLUCOSE 99 03/22/2013 0428   BUN 12  11/15/2016 0846   BUN 15 03/22/2013 0428   CREATININE 0.69 11/15/2016 0846   CREATININE 1.00 03/22/2013 0428   CALCIUM 8.5 (L) 11/15/2016 0846   CALCIUM 8.4 (L) 03/22/2013 0428   GFRNONAA >60 11/15/2016 0846   GFRNONAA >60 03/22/2013 0428   GFRAA >60 11/15/2016 0846   GFRAA >60 03/22/2013 0428   COAG Lab Results  Component Value Date   INR 1.11 11/14/2016   INR 1.05 08/24/2016     Disposition:  Discharge to :Home Discharge Instructions    Call MD for:  redness, tenderness, or signs of infection (pain, swelling, bleeding, redness, odor or green/yellow discharge around incision site)    Complete by:  As directed    Call MD for:  severe or increased pain, loss or decreased feeling  in affected limb(s)    Complete by:  As directed    Call MD for:  temperature >100.5    Complete by:  As directed    Resume previous diet    Complete by:  As directed        Medication List    STOP taking these medications   rivaroxaban 20 MG Tabs tablet Commonly known as:  XARELTO     TAKE these medications   apixaban 5 MG Tabs tablet Commonly known as:  ELIQUIS Take 2 tablets (10 mg total) by mouth 2 (two) times daily.   aspirin EC 81 MG tablet Take 1 tablet (81 mg total) by mouth daily.   atenolol 25 MG tablet Commonly known as:  TENORMIN Take 25 mg by  mouth daily.   atorvastatin 20 MG tablet Commonly known as:  LIPITOR Take 1 tablet (20 mg total) by mouth daily.   diltiazem 120 MG tablet Commonly known as:  CARDIZEM Take 120 mg by mouth daily.      Verbal and written Discharge instructions given to the patient. Wound care per Discharge AVS Follow-up Information    Hortencia Pilar, MD Follow up in 2 week(s).   Specialties:  Vascular Surgery, Cardiology, Radiology, Vascular Surgery Contact information: [address] [place] [address] [id number]           Signed: Hortencia Pilar, MD  11/16/2016, 12:49 PM

## 2016-11-16 NOTE — Op Note (Signed)
[facility] Percutaneous Study/Intervention Procedural Note   Date of Surgery: 11/14/2016 - 11/15/2016  Surgeon(s): Hortencia Pilar   Assistants:None  Pre-operative Diagnosis: Acute on chronic mesenteric ischemia with excruciating abdominal pain; palpitations vascular device with thrombosis of stent Post-operative diagnosis: Same  Procedure(s) Performed: 1. Ultrasound guidance for vascular access left radial artery 2. Catheter placement into SMA from left radial artery approach 3. Aortogram and selective SMA angiogram 4. Mechanical thrombectomy with the AngioJet Omni 120  5. Percutaneous transluminal angioplasty and stent placement of the SMA with a Lifestream stent               Anesthesia: Gen. anesthesia  Fluoro Time: 18 minutes  Contrast: Approximately 100 cc  Indications: Patient is a 75 year old male who presented with increasing abdominal pain. CT scan suggested thrombosis of a previously placed stent but was not adequate. Given his symptoms and this finding angiography was recommended.  Angiogram is performed to evaluate the lesion more thoroughly and potentially allow treatment. Risks and benefits were discussed and informed consent was obtained  Procedure: The patient was identified and appropriate procedural time out was performed. The patient was then placed supine on the table and prepped and draped in the usual sterile fashion.Moderate conscious sedation was administered during a face to face encounter with the patient with the RN monitoring their vital signs, mental status, telemetry and pulse oximetry throughout the procedure.  Ultrasound was used to evaluate the left radial artery. It was echolucent pulsatile indicating it was patent. A digital ultrasound image was acquired. A micropuncture needle was used to access the left radial artery under direct  ultrasound guidance and a microwire followed by micro-sheath was then inserted. A 0.035 J wire was advanced without resistance and a 5Fr sheath was placed. Pigtail catheter was then placed into the descending thoracic aorta and initially an AP aortogram was performed which showed the location of the SMA stent and confirmed that it was occluded. Lateral projection view was performed which showed typical orientations of the celiac and SMA.    5000 Units of heparin was then administered and allowed to circulate for several minutes  The SMA was then selected with a 1/8 at advantage wire and a 4 French angled glide catheter. Selective imaging of the superior mesenteric artery demonstrated occlusion at the proximal portion distally the artery appears to be widely patent with normal outflow.  A 7 French sheath was then inserted and positioned with its tip several centimeters above the SMA in order to perform the intervention.  6 mg of TPA was then reconstituted 50 cc and placed throughout the thrombosed segment. This was allowed to dwell for 15 minutes.  The AngioJet Omni 120 catheter was then prepped on the field and advanced through the sheath mechanical thrombectomy was then performed across the occlusion in the SMA. Multiple passes were made. Follow-up angiogram demonstrated only modest improvement.   I then selected a 8 mm diameter by 26 mm length life*stent. The stent and balloon was inflated to 12 atm. Huge improvement was seen on completion angiogram but there were still significant residual stenosis greater than 25%. Therefore a 9 x 4 balloon was advanced in the stent was postdilated to 16 atm.  Follow-up imaging both in the lateral and AP projections demonstrated an excellent result with preservation of the distal outflow.  I elected to terminate the procedure. The guide catheter was removed and oblique arteriogram was performed of the right femoral artery. StarClose closure device was deployed  in  the usual fashion with excellent hemostatic result.  Findings:  Aortogram: Diffuse disease but no focal hemodynamically significant lesions or stenoses SMA: Occluded in its proximal portion with normal distal outflow. Following antiplastic and stent placement screw that there is now a stent within the stent and then dilating this to 9 mm there is wide patency with preservation of the distal outflow     Disposition: Patient was taken to the recovery room in stable condition having tolerated the procedure well.   Hortencia Pilar 11/16/2016 12:55 PM

## 2016-11-16 NOTE — Anesthesia Postprocedure Evaluation (Signed)
Anesthesia Post Note  Patient: [name]  Procedure(s) Performed: Procedure(s) (LRB): Aortic Intervention (N/A) Visceral Angiography (N/A) Visceral Artery Intervention (N/A)  Patient location during evaluation: PACU Anesthesia Type: General Level of consciousness: awake and alert Pain management: pain level controlled Vital Signs Assessment: post-procedure vital signs reviewed and stable Respiratory status: spontaneous breathing, nonlabored ventilation, respiratory function stable and patient connected to nasal cannula oxygen Cardiovascular status: blood pressure returned to baseline and stable Postop Assessment: no signs of nausea or vomiting Anesthetic complications: no    Last Vitals:  Vitals:   11/16/16 0630 11/16/16 0848  BP: (!) 118/59 (!) 113/59  Pulse: 95 (!) 105  Resp: 20   Temp: 37.1 C     Last Pain:  Vitals:   11/16/16 0630  TempSrc: Oral  PainSc:                  Lamario Mani S

## 2016-11-16 NOTE — Care Management (Signed)
[name] aware [facility] admission.  Notified patient discharging today.  Information faxed to [phone]

## 2016-11-16 NOTE — Anesthesia Postprocedure Evaluation (Signed)
Anesthesia Post Note  Patient: KHAMAL JUSTINIANO  Procedure(s) Performed: Procedure(s) (LRB): Aortic Intervention (N/A) Visceral Angiography (N/A) Visceral Artery Intervention (N/A)  Patient location during evaluation: PACU Anesthesia Type: General Level of consciousness: awake and alert Pain management: pain level controlled Vital Signs Assessment: post-procedure vital signs reviewed and stable Respiratory status: spontaneous breathing, nonlabored ventilation, respiratory function stable and patient connected to nasal cannula oxygen Cardiovascular status: blood pressure returned to baseline and stable Postop Assessment: no signs of nausea or vomiting Anesthetic complications: no    Last Vitals:  Vitals:   11/16/16 0630 11/16/16 0848  BP: (!) 118/59 (!) 113/59  Pulse: 95 (!) 105  Resp: 20   Temp: 37.1 C     Last Pain:  Vitals:   11/16/16 0630  TempSrc: Oral  PainSc:                  Sanja Elizardo S

## 2016-11-16 NOTE — Progress Notes (Signed)
ANTICOAGULATION CONSULT NOTE - FOLLOW UP   Pharmacy Consult for heparin drip Indication: thrombosis of arterial stent  Allergies  Allergen Reactions  . Eliquis [Apixaban] Other (See Comments)    causes nose bleeds  . Oxycodone-Acetaminophen Other (See Comments)    hallucinations  . Penicillins Rash    Childhood allergy    Patient Measurements: Height: 5\' 7"  (170.2 cm) Weight: 140 lb (63.5 kg) IBW/kg (Calculated) : 66.1 Heparin Dosing Weight: 64kg  Vital Signs: Temp: 98.7 F (37.1 C) (11/30 0630) Temp Source: Oral (11/30 0630) BP: 113/59 (11/30 0848) Pulse Rate: 105 (11/30 0848)  Labs:  Recent Labs  11/14/16 1946 11/15/16 0846 11/15/16 2156 11/16/16 0519  HGB 14.5 12.9*  --  12.4*  HCT 41.9 38.4*  --  36.1*  PLT 329 298  --  289  APTT 46* 82* 72*  --   LABPROT 14.4  --   --   --   INR 1.11  --   --   --   HEPARINUNFRC  --  0.46 0.23* 0.39  CREATININE 0.78 0.69  --   --     Estimated Creatinine Clearance: 71.7 mL/min (by C-G formula based on SCr of 0.69 mg/dL).   Medical History: Past Medical History:  Diagnosis Date  . HLD (hyperlipidemia)   . Hypertension   . PVD (peripheral vascular disease) ([place])   . Vomiting    and abd pain    Medications:  On Xarelto as outpatient   Assessment:  Goal of Therapy:  Heparin level 0.3-0.7 units/ml Monitor platelets by anticoagulation protocol: Yes   Plan:  Patient being started on apixaban 10mg  PO BID. Will discontinue drip and consult.   Please reconsult if further pharmacy intervention is required.   Davin Archuletta L 11/16/2016,1:23 PM

## 2016-11-16 NOTE — Progress Notes (Signed)
IV was removed. Discharge instructions, follow-up appointments, and prescriptions were provided to the pt. All questions were answered. The pt was taken downstairs via wheelchair by [facility].

## 2016-11-17 MED ORDER — HEPARIN SODIUM (PORCINE) 1000 UNIT/ML IJ SOLN
INTRAMUSCULAR | Status: DC | PRN
Start: 2016-11-15 — End: 2016-11-17
  Administered 2016-11-15: 5 mL via INTRAVENOUS

## 2016-11-17 NOTE — Addendum Note (Signed)
Addendum  created 11/17/16 0702 by Nelda Marseille, CRNA   Anesthesia Intra Meds edited

## 2016-11-27 ENCOUNTER — Telehealth (INDEPENDENT_AMBULATORY_CARE_PROVIDER_SITE_OTHER): Payer: Self-pay | Admitting: Vascular Surgery

## 2016-11-27 NOTE — Telephone Encounter (Signed)
Patient stated he did not leave a message.

## 2016-11-27 NOTE — Telephone Encounter (Signed)
Pt stated the [facility] wanted him to take warfarin instead of Eliquis and he want to speak to someone concerning this matter.

## 2016-11-29 ENCOUNTER — Other Ambulatory Visit (INDEPENDENT_AMBULATORY_CARE_PROVIDER_SITE_OTHER): Payer: Self-pay | Admitting: Vascular Surgery

## 2016-11-29 DIAGNOSIS — K55059 Acute (reversible) ischemia of intestine, part and extent unspecified: Secondary | ICD-10-CM

## 2016-11-29 DIAGNOSIS — K551 Chronic vascular disorders of intestine: Secondary | ICD-10-CM

## 2016-11-30 ENCOUNTER — Encounter (INDEPENDENT_AMBULATORY_CARE_PROVIDER_SITE_OTHER): Payer: Medicare Other

## 2016-11-30 ENCOUNTER — Ambulatory Visit (INDEPENDENT_AMBULATORY_CARE_PROVIDER_SITE_OTHER): Payer: Medicare Other

## 2016-11-30 ENCOUNTER — Ambulatory Visit (INDEPENDENT_AMBULATORY_CARE_PROVIDER_SITE_OTHER): Payer: Medicare Other | Admitting: Vascular Surgery

## 2016-11-30 ENCOUNTER — Encounter (INDEPENDENT_AMBULATORY_CARE_PROVIDER_SITE_OTHER): Payer: Self-pay | Admitting: Vascular Surgery

## 2016-11-30 VITALS — BP 138/77 | HR 78 | Resp 16 | Ht 65.0 in | Wt 142.0 lb

## 2016-11-30 DIAGNOSIS — I749 Embolism and thrombosis of unspecified artery: Secondary | ICD-10-CM | POA: Diagnosis not present

## 2016-11-30 DIAGNOSIS — K55059 Acute (reversible) ischemia of intestine, part and extent unspecified: Secondary | ICD-10-CM | POA: Diagnosis not present

## 2016-11-30 DIAGNOSIS — E782 Mixed hyperlipidemia: Secondary | ICD-10-CM | POA: Diagnosis not present

## 2016-11-30 DIAGNOSIS — K55069 Acute infarction of intestine, part and extent unspecified: Secondary | ICD-10-CM

## 2016-11-30 DIAGNOSIS — K559 Vascular disorder of intestine, unspecified: Secondary | ICD-10-CM

## 2016-11-30 DIAGNOSIS — K551 Chronic vascular disorders of intestine: Secondary | ICD-10-CM | POA: Diagnosis not present

## 2016-11-30 NOTE — Progress Notes (Signed)
Subjective:    Patient ID: [name], male    DOB: [birth year], 75 y.o.   MRN: [id number] Chief Complaint  Patient presents with  . Re-evaluation    Ultrasound follow up   oxy catheter, percutaneous transluminal angioplasty of the SMA stent with 5 mm diameter Lutonix drug-coated balloon and 7 mm diameter high pressure angioplasty balloon with StarClose closure device left femoral artery on 11/13/16 with ultrasound guidance for vascular access left radial artery, catheter placement into SMA from left radial artery approach, Aortogram and selective SMA angiogram, mechanical thrombectomy with the AngioJet Omni 120 with percutaneous transluminal angioplasty and stent placement of the SMA with a Lifestream stent on 11/15/16. Patient is without complaint. The patient is without complaint and denies any symptoms such as post prandial pain, weight loss, fear of eating, nausea, vomiting, constipation or diarrhea. The patient underwent a mesenteric artery duplex which was notable for a patent SMA.   Review of Systems  Constitutional: Negative.   HENT: Negative.   Eyes: Negative.   Respiratory: Negative.   Cardiovascular: Negative.   Gastrointestinal: Negative.   Endocrine: Negative.   Genitourinary: Negative.   Musculoskeletal: Negative.   Skin: Negative.   Allergic/Immunologic: Negative.   Neurological: Negative.   Hematological: Negative.   Psychiatric/Behavioral: Negative.       Objective:   Physical Exam  Constitutional: He is oriented to person, place, and time. He appears well-developed and well-nourished.  HENT:  Head: Normocephalic and atraumatic.  Right Ear: External ear normal.  Left Ear: External ear normal.  Eyes: Conjunctivae and EOM are normal. Pupils are equal, round, and reactive to light.  Neck: Normal range of motion.  Cardiovascular: Normal rate, regular rhythm, normal heart sounds and intact distal pulses.   Pulses:      Radial pulses are 2+ on the right side,  and 2+ on the left side.       Dorsalis pedis pulses are 2+ on the right side, and 2+ on the left side.       Posterior tibial pulses are 2+ on the right side, and 2+ on the left side.  Pulmonary/Chest: Effort normal and breath sounds normal.  Abdominal: Soft. Bowel sounds are normal.  Musculoskeletal: Normal range of motion.  Neurological: He is alert and oriented to person, place, and time.  Skin: Skin is warm and dry.  Psychiatric: He has a normal mood and affect. His behavior is normal. Judgment and thought content normal.   BP 138/77 (BP Location: Left Arm)   Pulse 78   Resp 16   Ht 5\' 5"  (1.651 m)   Wt 142 lb (64.4 kg)   BMI 23.63 kg/m   Past Medical History:  Diagnosis Date  . HLD (hyperlipidemia)   . Hypertension   . PVD (peripheral vascular disease) ([place])   . Vomiting    and abd pain   Social History   Social History  . Marital status: Widowed    Spouse name: N/A  . Number of children: N/A  . Years of education: N/A   Occupational History  . Not on file.   Social History Main Topics  . Smoking status: Former Smoker    Types: E-cigarettes  . Smokeless tobacco: Former Systems developer  . Alcohol use 1.2 oz/week    2 Cans of beer per week  . Drug use: No  . Sexual activity: Not on file   Other Topics Concern  . Not on file   Social History Narrative  . No narrative  on file   Past Surgical History:  Procedure Laterality Date  . PERIPHERAL VASCULAR CATHETERIZATION N/A 06/15/2015   Procedure: Abdominal Aortogram w/Lower Extremity;  Surgeon: Katha Cabal, MD;  Location: [place] [facility];  Service: Cardiovascular;  Laterality: N/A;  . PERIPHERAL VASCULAR CATHETERIZATION  06/15/2015   Procedure: Lower Extremity Intervention;  Surgeon: Katha Cabal, MD;  Location: [facility];  Service: Cardiovascular;;  . PERIPHERAL VASCULAR CATHETERIZATION N/A 08/25/2016   Procedure: Abdominal Aortogram;  Surgeon: Algernon Huxley, MD;  Location: [facility];   Service: Cardiovascular;  Laterality: N/A;  . PERIPHERAL VASCULAR CATHETERIZATION N/A 08/25/2016   Procedure: Visceral Artery Intervention;  Surgeon: Algernon Huxley, MD;  Location: [facility];  Service: Cardiovascular;  Laterality: N/A;  . PERIPHERAL VASCULAR CATHETERIZATION N/A 11/13/2016   Procedure: Visceral Angiography;  Surgeon: Algernon Huxley, MD;  Location: [place] [facility];  Service: Cardiovascular;  Laterality: N/A;  . PERIPHERAL VASCULAR CATHETERIZATION N/A 11/15/2016   Procedure: Aortic Intervention;  Surgeon: Katha Cabal, MD;  Location: [place] [facility];  Service: Cardiovascular;  Laterality: N/A;  . PERIPHERAL VASCULAR CATHETERIZATION N/A 11/15/2016   Procedure: Visceral Angiography;  Surgeon: Katha Cabal, MD;  Location: [place] [facility];  Service: Cardiovascular;  Laterality: N/A;  . PERIPHERAL VASCULAR CATHETERIZATION N/A 11/15/2016   Procedure: Visceral Artery Intervention;  Surgeon: Katha Cabal, MD;  Location: [facility];  Service: Cardiovascular;  Laterality: N/A;  . TONSILLECTOMY     Family History  Problem Relation Age of Onset  . Heart attack Mother   . Heart attack Father   . Basal cell carcinoma Father    Allergies  Allergen Reactions  . Eliquis [Apixaban] Other (See Comments)    causes nose bleeds  . Oxycodone-Acetaminophen Other (See Comments)    hallucinations  . Penicillins Rash    Childhood allergy      Assessment & Plan:  Patient presents for his first post-procedure follow up. He is s/p ultrasound guidance for vascular access left femoral artery, catheter placement into SMA branches from left femoral approach, aortogram and selective angiogram of the superior mesenteric artery, catheter directed thrombolytic therapy with 4 mg of TPA delivered and a power pulse spray fashion to the SMA with the AngioJet proxy catheter, mechanical rheolytic thrombectomy to the SMA with the AngioJet proxy catheter, percutaneous  transluminal angioplasty of the SMA stent with 5 mm diameter Lutonix drug-coated balloon and 7 mm diameter high pressure angioplasty balloon with StarClose closure device left femoral artery on 11/13/16 with ultrasound guidance for vascular access left radial artery, catheter placement into SMA from left radial artery approach, Aortogram and selective SMA angiogram, mechanical thrombectomy with the AngioJet Omni 120 with percutaneous transluminal angioplasty and stent placement of the SMA with a Lifestream stent on 11/15/16. Patient is without complaint. The patient is without complaint and denies any symptoms such as post prandial pain, weight loss, fear of eating, nausea, vomiting, constipation or diarrhea. The patient underwent a mesenteric artery duplex which was notable for a patent SMA.   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  Patent SMA on duplex. [facility] Eliquis to coumadin as Eliquis is not covered.  INR should be between 2.0 - 3.0  2. Mesenteric ischemia (HCC) - Improved I have discussed with the patient at length the risk factors for and pathogenesis of atherosclerotic disease and encouraged a healthy diet, regular exercise regimen and blood pressure /  glucose control. The patient was encouraged to call the office in the interim if she experiences post prandial pain, weight loss, fear of eating, nausea, vomiting, constipation or diarrhea. The patient expresses their understanding.  - V[facility] [place] MESENTERIC DUPLEX; Future  3. Mixed hyperlipidemia - Stable Encouraged good control as its slows the progression of atherosclerotic disease  Current Outpatient Prescriptions on File Prior to Visit  Medication Sig Dispense Refill  . apixaban (ELIQUIS) 5 MG TABS tablet Take 2 tablets (10 mg total) by mouth 2 (two) times daily. 60 tablet 6  . aspirin EC 81 MG tablet Take 1 tablet (81 mg total) by mouth daily. 150 tablet 2  . atenolol (TENORMIN) 25 MG  tablet Take 25 mg by mouth daily.    Marland Kitchen atorvastatin (LIPITOR) 20 MG tablet Take 1 tablet (20 mg total) by mouth daily. 30 tablet 3  . diltiazem (CARDIZEM) 120 MG tablet Take 120 mg by mouth daily.      No current facility-administered medications on file prior to visit.     There are no Patient Instructions on file for this visit. Return in about 3 months (around 02/28/2017) for Mesenteric Stenosis Follow Up.   Umeka Wrench A Jadasia Haws, PA-C

## 2017-03-07 ENCOUNTER — Encounter (INDEPENDENT_AMBULATORY_CARE_PROVIDER_SITE_OTHER): Payer: Medicare Other

## 2017-03-07 ENCOUNTER — Ambulatory Visit (INDEPENDENT_AMBULATORY_CARE_PROVIDER_SITE_OTHER): Payer: Medicare PPO

## 2017-03-07 DIAGNOSIS — K559 Vascular disorder of intestine, unspecified: Secondary | ICD-10-CM | POA: Diagnosis not present

## 2017-03-09 ENCOUNTER — Other Ambulatory Visit (INDEPENDENT_AMBULATORY_CARE_PROVIDER_SITE_OTHER): Payer: Self-pay | Admitting: Vascular Surgery

## 2017-03-09 DIAGNOSIS — I70213 Atherosclerosis of native arteries of extremities with intermittent claudication, bilateral legs: Secondary | ICD-10-CM

## 2017-03-12 ENCOUNTER — Ambulatory Visit (INDEPENDENT_AMBULATORY_CARE_PROVIDER_SITE_OTHER): Payer: Medicare PPO

## 2017-03-12 ENCOUNTER — Encounter (INDEPENDENT_AMBULATORY_CARE_PROVIDER_SITE_OTHER): Payer: Self-pay | Admitting: Vascular Surgery

## 2017-03-12 ENCOUNTER — Ambulatory Visit (INDEPENDENT_AMBULATORY_CARE_PROVIDER_SITE_OTHER): Payer: Medicare PPO | Admitting: Vascular Surgery

## 2017-03-12 VITALS — BP 160/76 | HR 72 | Resp 16 | Wt 149.0 lb

## 2017-03-12 DIAGNOSIS — K559 Vascular disorder of intestine, unspecified: Secondary | ICD-10-CM | POA: Diagnosis not present

## 2017-03-12 DIAGNOSIS — E782 Mixed hyperlipidemia: Secondary | ICD-10-CM

## 2017-03-12 DIAGNOSIS — I70213 Atherosclerosis of native arteries of extremities with intermittent claudication, bilateral legs: Secondary | ICD-10-CM

## 2017-03-12 DIAGNOSIS — I739 Peripheral vascular disease, unspecified: Secondary | ICD-10-CM | POA: Diagnosis not present

## 2017-03-12 DIAGNOSIS — I10 Essential (primary) hypertension: Secondary | ICD-10-CM | POA: Diagnosis not present

## 2017-03-12 NOTE — Progress Notes (Signed)
MRN : [id number]  [name] is a 76 y.o. ([birth year]) male who presents with chief complaint of  Chief Complaint  Patient presents with  . Follow-up  .  History of Present Illness: The patient returns to the office for followup and review of the noninvasive studies. There have been no interval changes in lower extremity symptoms. No interval shortening of the patient's claudication distance or development of rest pain symptoms. No new ulcers or wounds have occurred since the last visit.  He denies abdominal pain no problems with eating  There have been no significant changes to the patient's overall health care.  The patient denies amaurosis fugax or recent TIA symptoms. There are no recent neurological changes noted. The patient denies history of DVT, PE or superficial thrombophlebitis. The patient denies recent episodes of angina or shortness of breath.   ABI Rt=0.59 and Lt=0.71  (previous ABI's Rt=0.69 and Lt=0.70) Duplex ultrasound of the aorta iliac shows patent stents no hemodynamically significant restenosis  Duplex ultrasound of the SMA shows patent stent no hemodynamically significant restenosis   Current Meds  Medication Sig  . apixaban (ELIQUIS) 5 MG TABS tablet Take 2 tablets (10 mg total) by mouth 2 (two) times daily.  Marland Kitchen aspirin EC 81 MG tablet Take 1 tablet (81 mg total) by mouth daily.  Marland Kitchen atenolol (TENORMIN) 25 MG tablet Take 25 mg by mouth daily.  Marland Kitchen atorvastatin (LIPITOR) 20 MG tablet Take 1 tablet (20 mg total) by mouth daily.  Marland Kitchen diltiazem (CARDIZEM) 120 MG tablet Take 120 mg by mouth daily.     Past Medical History:  Diagnosis Date  . HLD (hyperlipidemia)   . Hypertension   . PVD (peripheral vascular disease) ([place])   . Vomiting    and abd pain    Past Surgical History:  Procedure Laterality Date  . PERIPHERAL VASCULAR CATHETERIZATION N/A 06/15/2015   Procedure: Abdominal Aortogram w/Lower Extremity;  Surgeon: Katha Cabal, MD;  Location:  [place] [facility];  Service: Cardiovascular;  Laterality: N/A;  . PERIPHERAL VASCULAR CATHETERIZATION  06/15/2015   Procedure: Lower Extremity Intervention;  Surgeon: Katha Cabal, MD;  Location: [facility] [facility];  Service: Cardiovascular;;  . PERIPHERAL VASCULAR CATHETERIZATION N/A 08/25/2016   Procedure: Abdominal Aortogram;  Surgeon: Algernon Huxley, MD;  Location: [facility];  Service: Cardiovascular;  Laterality: N/A;  . PERIPHERAL VASCULAR CATHETERIZATION N/A 08/25/2016   Procedure: Visceral Artery Intervention;  Surgeon: Algernon Huxley, MD;  Location: [place] [facility];  Service: Cardiovascular;  Laterality: N/A;  . PERIPHERAL VASCULAR CATHETERIZATION N/A 11/13/2016   Procedure: Visceral Angiography;  Surgeon: Algernon Huxley, MD;  Location: [facility] [facility];  Service: Cardiovascular;  Laterality: N/A;  . PERIPHERAL VASCULAR CATHETERIZATION N/A 11/15/2016   Procedure: Aortic Intervention;  Surgeon: Katha Cabal, MD;  Location: [place] [facility];  Service: Cardiovascular;  Laterality: N/A;  . PERIPHERAL VASCULAR CATHETERIZATION N/A 11/15/2016   Procedure: Visceral Angiography;  Surgeon: Katha Cabal, MD;  Location: [place] [facility];  Service: Cardiovascular;  Laterality: N/A;  . PERIPHERAL VASCULAR CATHETERIZATION N/A 11/15/2016   Procedure: Visceral Artery Intervention;  Surgeon: Katha Cabal, MD;  Location: [facility];  Service: Cardiovascular;  Laterality: N/A;  . TONSILLECTOMY      Social History Social History  Substance Use Topics  . Smoking status: Former Smoker    Types: E-cigarettes  . Smokeless tobacco: Former Systems developer  . Alcohol use 1.2 oz/week  2 Cans of beer per week    Family History Family History  Problem Relation Age of Onset  . Heart attack Mother   . Heart attack Father   . Basal cell carcinoma Father   No family history of bleeding/clotting disorders, porphyria or autoimmune disease   Allergies  Allergen  Reactions  . Eliquis [Apixaban] Other (See Comments)    causes nose bleeds  . Oxycodone-Acetaminophen Other (See Comments)    hallucinations  . Penicillins Rash    Childhood allergy     REVIEW OF SYSTEMS (Negative unless checked)  Constitutional: [] Weight loss  [] Fever  [] Chills Cardiac: [] Chest pain   [] Chest pressure   [] Palpitations   [] Shortness of breath when laying flat   [] Shortness of breath with exertion. Vascular:  [x] Pain in legs with walking   [] Pain in legs at rest  [] History of DVT   [] Phlebitis   [] Swelling in legs   [] Varicose veins   [] Non-healing ulcers Pulmonary:   [] Uses home oxygen   [] Productive cough   [] Hemoptysis   [] Wheeze  [] COPD   [] Asthma Neurologic:  [] Dizziness   [] Seizures   [] History of stroke   [] History of TIA  [] Aphasia   [] Vissual changes   [] Weakness or numbness in arm   [] Weakness or numbness in leg Musculoskeletal:   [] Joint swelling   [] Joint pain   [] Low back pain Hematologic:  [] Easy bruising  [] Easy bleeding   [] Hypercoagulable state   [] Anemic Gastrointestinal:  [] Diarrhea   [] Vomiting  [] Gastroesophageal reflux/heartburn   [] Difficulty swallowing. Genitourinary:  [x] Chronic kidney disease   [] Difficult urination  [] Frequent urination   [] Blood in urine Skin:  [] Rashes   [] Ulcers  Psychological:  [] History of anxiety   []  History of major depression.  Physical Examination  Vitals:   03/12/17 0833  BP: (!) 160/76  Pulse: 72  Resp: 16  Weight: 149 lb (67.6 kg)   Body mass index is 24.79 kg/m. Gen: WD/WN, NAD Head: [place]/AT, No temporalis wasting.  Ear/Nose/Throat: Hearing grossly intact, nares w/o erythema or drainage, poor dentition Eyes: PER, EOMI, sclera nonicteric.  Neck: Supple, no masses.  No bruit or JVD.  Pulmonary:  Good air movement, clear to auscultation bilaterally, no use of accessory muscles.  Cardiac: RRR, normal S1, S2, no Murmurs. Vascular:  Vessel Right Left  Radial Palpable Palpable  Ulnar Palpable Palpable    Brachial Palpable Palpable  Carotid Palpable Palpable  Femoral Palpable Palpable  Popliteal Not Palpable Not Palpable  PT Not Palpable Not Palpable  DP Not Palpable Not Palpable   Gastrointestinal: soft, non-distended. No guarding/no peritoneal signs.  Musculoskeletal: M/S 5/5 throughout.  No deformity or atrophy.  Neurologic: CN 2-12 intact. Pain and light touch intact in extremities.  Symmetrical.  Speech is fluent. Motor exam as listed above. Psychiatric: Judgment intact, Mood & affect appropriate for pt's clinical situation. Dermatologic: No rashes or ulcers noted.  No changes consistent with cellulitis. Lymph : No Cervical lymphadenopathy, no lichenification or skin changes of chronic lymphedema.  CBC Lab Results  Component Value Date   WBC 11.5 (H) 11/16/2016   HGB 12.4 (L) 11/16/2016   HCT 36.1 (L) 11/16/2016   MCV 94.9 11/16/2016   PLT 289 11/16/2016    BMET    Component Value Date/Time   NA 136 11/15/2016 0846   NA 137 03/22/2013 0428   K 3.3 (L) 11/15/2016 0846   K 3.8 03/22/2013 0428   CL 100 (L) 11/15/2016 0846   CL 102 03/22/2013 0428   CO2  28 11/15/2016 0846   CO2 32 03/22/2013 0428   GLUCOSE 101 (H) 11/15/2016 0846   GLUCOSE 99 03/22/2013 0428   BUN 12 11/15/2016 0846   BUN 15 03/22/2013 0428   CREATININE 0.69 11/15/2016 0846   CREATININE 1.00 03/22/2013 0428   CALCIUM 8.5 (L) 11/15/2016 0846   CALCIUM 8.4 (L) 03/22/2013 0428   GFRNONAA >60 11/15/2016 0846   GFRNONAA >60 03/22/2013 0428   GFRAA >60 11/15/2016 0846   GFRAA >60 03/22/2013 0428   CrCl cannot be calculated (Patient's most recent lab result is older than the maximum 21 days allowed.).  COAG Lab Results  Component Value Date   INR 1.11 11/14/2016   INR 1.05 08/24/2016    Radiology No results found.  Assessment/Plan 1. PAD (peripheral artery disease) (HCC)  Recommend:  The patient has evidence of atherosclerosis of the lower extremities with claudication.  The patient does not  voice lifestyle limiting changes at this point in time.  Noninvasive studies do not suggest clinically significant change.  No invasive studies, angiography or surgery at this time The patient should continue walking and begin a more formal exercise program.  The patient should continue antiplatelet therapy and aggressive treatment of the lipid abnormalities  No changes in the patient's medications at this time  The patient should continue wearing graduated compression socks 10-15 mmHg strength to control the mild edema.    2. Mesenteric ischemia (HCC)  Recommend:  The patient has evidence of atherosclerosis of the superior mesenteric artery.  However, his stent is patent and he is symptoms free at this time. The patient does not voice lifestyle limiting changes at this point in time.  Noninvasive studies do not suggest clinically significant change.  No invasive studies, angiography or surgery at this time The patient should continue walking and begin a more formal exercise program.  The patient should continue antiplatelet therapy and aggressive treatment of the lipid abnormalities  No changes in the patient's medications at this time   3. Essential hypertension Continue antihypertensive medications as already ordered, these medications have been reviewed and there are no changes at this time.   4. Mixed hyperlipidemia Continue statin as ordered and reviewed, no changes at this time     Hortencia Pilar, MD  03/12/2017 9:17 AM

## 2017-12-26 ENCOUNTER — Encounter
Admission: RE | Admit: 2017-12-26 | Discharge: 2017-12-26 | Disposition: A | Discharge status: Home / Self Care | Payer: Medicare PPO | Source: Ambulatory Visit | Attending: Orthopedic Surgery | Admitting: Orthopedic Surgery

## 2017-12-26 ENCOUNTER — Other Ambulatory Visit: Payer: Self-pay

## 2017-12-26 ENCOUNTER — Telehealth (INDEPENDENT_AMBULATORY_CARE_PROVIDER_SITE_OTHER): Payer: Self-pay | Admitting: Vascular Surgery

## 2017-12-26 DIAGNOSIS — Z01818 Encounter for other preprocedural examination: Secondary | ICD-10-CM | POA: Diagnosis present

## 2017-12-26 DIAGNOSIS — Z0183 Encounter for blood typing: Secondary | ICD-10-CM | POA: Insufficient documentation

## 2017-12-26 DIAGNOSIS — I10 Essential (primary) hypertension: Secondary | ICD-10-CM | POA: Diagnosis not present

## 2017-12-26 DIAGNOSIS — Z01812 Encounter for preprocedural laboratory examination: Secondary | ICD-10-CM | POA: Diagnosis not present

## 2017-12-26 DIAGNOSIS — R399 Unspecified symptoms and signs involving the genitourinary system: Secondary | ICD-10-CM | POA: Insufficient documentation

## 2017-12-26 DIAGNOSIS — I4510 Unspecified right bundle-branch block: Secondary | ICD-10-CM | POA: Diagnosis not present

## 2017-12-26 DIAGNOSIS — R9431 Abnormal electrocardiogram [ECG] [EKG]: Secondary | ICD-10-CM | POA: Diagnosis not present

## 2017-12-26 HISTORY — DX: Chronic obstructive pulmonary disease, unspecified: J44.9

## 2017-12-26 LAB — COMPREHENSIVE METABOLIC PANEL
ALT: 21 U/L (ref 17–63)
AST: 38 U/L (ref 15–41)
Albumin: 4.2 g/dL (ref 3.5–5.0)
Alkaline Phosphatase: 74 U/L (ref 38–126)
Anion gap: 9 (ref 5–15)
BUN: 16 mg/dL (ref 6–20)
CHLORIDE: 98 mmol/L — AB (ref 101–111)
CO2: 30 mmol/L (ref 22–32)
Calcium: 9.3 mg/dL (ref 8.9–10.3)
Creatinine, Ser: 0.86 mg/dL (ref 0.61–1.24)
Glucose, Bld: 110 mg/dL — ABNORMAL HIGH (ref 65–99)
POTASSIUM: 3.5 mmol/L (ref 3.5–5.1)
SODIUM: 137 mmol/L (ref 135–145)
Total Bilirubin: 0.9 mg/dL (ref 0.3–1.2)
Total Protein: 7.3 g/dL (ref 6.5–8.1)

## 2017-12-26 LAB — URINALYSIS, ROUTINE W REFLEX MICROSCOPIC
BACTERIA UA: NONE SEEN
BILIRUBIN URINE: NEGATIVE
Glucose, UA: NEGATIVE mg/dL
Ketones, ur: NEGATIVE mg/dL
Leukocytes, UA: NEGATIVE
Nitrite: NEGATIVE
Protein, ur: NEGATIVE mg/dL
SPECIFIC GRAVITY, URINE: 1.012 (ref 1.005–1.030)
pH: 6 (ref 5.0–8.0)

## 2017-12-26 LAB — TYPE AND SCREEN
ABO/RH(D): O POS
ANTIBODY SCREEN: NEGATIVE

## 2017-12-26 LAB — SURGICAL PCR SCREEN
MRSA, PCR: NEGATIVE
STAPHYLOCOCCUS AUREUS: NEGATIVE

## 2017-12-26 LAB — CBC
HCT: 37.4 % — ABNORMAL LOW (ref 40.0–52.0)
Hemoglobin: 12.8 g/dL — ABNORMAL LOW (ref 13.0–18.0)
MCH: 34.9 pg — AB (ref 26.0–34.0)
MCHC: 34.3 g/dL (ref 32.0–36.0)
MCV: 101.8 fL — AB (ref 80.0–100.0)
PLATELETS: 345 10*3/uL (ref 150–440)
RBC: 3.67 MIL/uL — ABNORMAL LOW (ref 4.40–5.90)
RDW: 14.7 % — AB (ref 11.5–14.5)
WBC: 8 10*3/uL (ref 3.8–10.6)

## 2017-12-26 LAB — PROTIME-INR
INR: 2.79
PROTHROMBIN TIME: 29.2 s — AB (ref 11.4–15.2)

## 2017-12-26 LAB — SEDIMENTATION RATE: SED RATE: 12 mm/h (ref 0–20)

## 2017-12-26 LAB — APTT: aPTT: 48 seconds — ABNORMAL HIGH (ref 24–36)

## 2017-12-26 LAB — C-REACTIVE PROTEIN

## 2017-12-26 NOTE — Telephone Encounter (Signed)
Attempted to call patient and LMVM for him to call back to schedule an appt with GS. Per Rosann Auerbach in pre-admit patient needs to see GS before his total knee sgy on 1/21. She states patient needs to know when coumadin needs to be stopped for sgy. I requested a form of some sort for Mickel Baas for this she stated that Mickel Baas knew her and to just let her know that Rosann Auerbach is requesting this.

## 2017-12-26 NOTE — Patient Instructions (Signed)
  Your procedure is scheduled DH:RCBULA Jan. 21th , 2019. Report to Same Day Surgery 2nd floor of [facility]. To find out your arrival time please call [phone] between 1PM - 3PM on Friday Jan. 18th, 2019.  Remember: Instructions that are not followed completely may result in serious medical risk, up to and including death, or upon the discretion of your surgeon and anesthesiologist your surgery may need to be rescheduled.    _x___ 1. Do not eat food after midnight night prior to surgery.     No gum chewing or hard candies, snacks or breakfast.     May drink the following up until 2 hours prior to arrival to hospital:      water      Gatorade     clear apple juice      black coffee      or black tea      __x__ 2. No Alcohol for 24 hours before or after surgery.   ____ 3. Bring all medications with you on the day of surgery if instructed.    __x__ 4. Notify your doctor if there is any change in your medical condition     (cold, fever, infections).    __x___ 5.   Do Not Smoke or use e-cigarettes For 24 Hours Prior to Your   Surgery.  Do not use any chewable tobacco products for at least 6   hours prior to  surgery.                  Do not wear jewelry, make-up, hairpins, clips or nail polish.  Do not wear lotions, powders, or perfumes.   Do not shave 48 hours prior to surgery. Men may shave face and neck.  Do not bring valuables to the hospital.    [facility] is not responsible for any belongings or valuables.               Contacts, dentures or bridgework may not be worn into surgery.  Leave your suitcase in the car. After surgery it may be brought to your room.  For patients admitted to the hospital, discharge time is determined by your  treatment team.   Patients discharged the day of surgery will not be allowed to drive home.    Please read over the following fact sheets that you were given:   [facility] Preparing for Surgery             __x__ Take these  medicines the morning of surgery with A SIP OF WATER:    1. Atenolol  2. Atorvastatin  3. Ditiazem      ____ Fleet Enema (as directed)   __x__ Use CHG Soap as directed on instruction sheet  __x__  Bring inhaler to hospital day of surgery  ____ Stop metformin 2 days prior to surgery    ____ Take 1/2 of usual insulin dose the night before surgery and none on the morning of          surgery.   _x_  Check with Dr. Nehemiah Massed regarding if and when to stop Warfarin  _x___ Stop Anti-inflammatories such as Advil, Aleve, Ibuprofen, Motrin, Naproxen,  Naprosyn, Goodies powders or aspirin products. OK to take Tylenol.   ____ Stop supplements until after surgery.    ____ Bring C-Pap to the hospital.

## 2017-12-26 NOTE — Pre-Procedure Instructions (Signed)
Spoke with Margaretha Sheffield at Dr. Clydell Hakim office regarding if and when to stop Pt's Coumadin, not mention in Dr. Clydell Hakim or [facility] notes.  Dr. Marry Guan did mention he would like pt to see Dr. Delana Meyer prior to surgery due to numerous vascular procedures pt has had with him in the past. Pt does not have an appt. Scheduled, this nurse called Dr. Nino Parsley office, voicemail answered, left message as stated above and asked office to call pt at home with an appt prior to surgery on 01/07/2018.

## 2017-12-27 ENCOUNTER — Ambulatory Visit (INDEPENDENT_AMBULATORY_CARE_PROVIDER_SITE_OTHER): Payer: Medicare PPO | Admitting: Vascular Surgery

## 2017-12-27 ENCOUNTER — Encounter (INDEPENDENT_AMBULATORY_CARE_PROVIDER_SITE_OTHER): Payer: Self-pay | Admitting: Vascular Surgery

## 2017-12-27 VITALS — BP 153/75 | HR 79 | Resp 17 | Ht 67.0 in | Wt 156.0 lb

## 2017-12-27 DIAGNOSIS — K559 Vascular disorder of intestine, unspecified: Secondary | ICD-10-CM | POA: Diagnosis not present

## 2017-12-27 DIAGNOSIS — E782 Mixed hyperlipidemia: Secondary | ICD-10-CM | POA: Diagnosis not present

## 2017-12-27 DIAGNOSIS — I10 Essential (primary) hypertension: Secondary | ICD-10-CM

## 2017-12-27 DIAGNOSIS — M1712 Unilateral primary osteoarthritis, left knee: Secondary | ICD-10-CM | POA: Diagnosis not present

## 2017-12-27 DIAGNOSIS — I739 Peripheral vascular disease, unspecified: Secondary | ICD-10-CM

## 2017-12-28 LAB — URINE CULTURE: SPECIAL REQUESTS: NORMAL

## 2017-12-28 LAB — IGE: IgE (Immunoglobulin E), Serum: 1298 IU/mL — ABNORMAL HIGH (ref 0–100)

## 2017-12-29 ENCOUNTER — Encounter (INDEPENDENT_AMBULATORY_CARE_PROVIDER_SITE_OTHER): Payer: Self-pay | Admitting: Vascular Surgery

## 2017-12-29 DIAGNOSIS — M1710 Unilateral primary osteoarthritis, unspecified knee: Secondary | ICD-10-CM | POA: Insufficient documentation

## 2017-12-29 DIAGNOSIS — M179 Osteoarthritis of knee, unspecified: Secondary | ICD-10-CM | POA: Insufficient documentation

## 2017-12-29 NOTE — Progress Notes (Signed)
MRN : [id number]  [name] is a 77 y.o. ([birth year]) male who presents with chief complaint of  Chief Complaint  Patient presents with  . Follow-up    Upcoming surgery with [facility]  .  History of Present Illness: The patient returns to the office for followup. There have been no interval changes in lower extremity symptoms. No interval shortening of the patient's claudication distance or development of rest pain symptoms. No new ulcers or wounds have occurred since the last visit.  There have been no significant changes to the patient's overall health care.  The patient denies amaurosis fugax or recent TIA symptoms. There are no recent neurological changes noted. The patient denies history of DVT, PE or superficial thrombophlebitis. The patient denies recent episodes of angina or shortness of breath.   Current Meds  Medication Sig  . atenolol (TENORMIN) 25 MG tablet Take 25 mg by mouth daily.  Marland Kitchen atorvastatin (LIPITOR) 40 MG tablet Take 20 mg by mouth daily. Takes 0.5 tablet  . diltiazem (CARDIZEM) 120 MG tablet Take 120 mg by mouth daily.   . hydrocortisone 2.5 % cream Apply topically.  Marland Kitchen lisinopril (PRINIVIL,ZESTRIL) 20 MG tablet Take 20 mg by mouth daily. In am.  . warfarin (COUMADIN) 2 MG tablet Take by mouth.    Past Medical History:  Diagnosis Date  . COPD (chronic obstructive pulmonary disease) ([place])   . HLD (hyperlipidemia)   . Hypertension   . PVD (peripheral vascular disease) ([place])   . Vomiting    and abd pain    Past Surgical History:  Procedure Laterality Date  . PERIPHERAL VASCULAR CATHETERIZATION N/A 06/15/2015   Procedure: Abdominal Aortogram w/Lower Extremity;  Surgeon: Katha Cabal, MD;  Location: [place] [facility];  Service: Cardiovascular;  Laterality: N/A;  . PERIPHERAL VASCULAR CATHETERIZATION  06/15/2015   Procedure: Lower Extremity Intervention;  Surgeon: Katha Cabal, MD;  Location: [place] [facility];  Service: Cardiovascular;;    . PERIPHERAL VASCULAR CATHETERIZATION N/A 08/25/2016   Procedure: Abdominal Aortogram;  Surgeon: Algernon Huxley, MD;  Location: [facility] [facility];  Service: Cardiovascular;  Laterality: N/A;  . PERIPHERAL VASCULAR CATHETERIZATION N/A 08/25/2016   Procedure: Visceral Artery Intervention;  Surgeon: Algernon Huxley, MD;  Location: [place] [facility];  Service: Cardiovascular;  Laterality: N/A;  . PERIPHERAL VASCULAR CATHETERIZATION N/A 11/13/2016   Procedure: Visceral Angiography;  Surgeon: Algernon Huxley, MD;  Location: [place] [facility];  Service: Cardiovascular;  Laterality: N/A;  . PERIPHERAL VASCULAR CATHETERIZATION N/A 11/15/2016   Procedure: Aortic Intervention;  Surgeon: Katha Cabal, MD;  Location: [place] [facility];  Service: Cardiovascular;  Laterality: N/A;  . PERIPHERAL VASCULAR CATHETERIZATION N/A 11/15/2016   Procedure: Visceral Angiography;  Surgeon: Katha Cabal, MD;  Location: [place] [facility];  Service: Cardiovascular;  Laterality: N/A;  . PERIPHERAL VASCULAR CATHETERIZATION N/A 11/15/2016   Procedure: Visceral Artery Intervention;  Surgeon: Katha Cabal, MD;  Location: [place] [place] [facility];  Service: Cardiovascular;  Laterality: N/A;  . TONSILLECTOMY      Social History Social History   Tobacco Use  . Smoking status: Former Smoker    Types: E-cigarettes    Last attempt to quit: 2014    Years since quitting: 5.0  . Smokeless tobacco: Never Used  Substance Use Topics  . Alcohol use: Yes    Alcohol/week: 1.2 - 2.4 oz    Types: 2 - 4 Cans of beer per week  . Drug  use: No    Family History Family History  Problem Relation Age of Onset  . Heart attack Mother   . Heart attack Father   . Basal cell carcinoma Father     Allergies  Allergen Reactions  . Eliquis [Apixaban] Other (See Comments)    causes nose bleeds  . Oxycodone-Acetaminophen Other (See Comments)    hallucinations  . Penicillins Rash    Childhood allergy Has patient had a PCN  reaction causing immediate rash, facial/tongue/throat swelling, SOB or lightheadedness with hypotension: No Has patient had a PCN reaction causing severe rash involving mucus membranes or skin necrosis: No Has patient had a PCN reaction that required hospitalization: No Has patient had a PCN reaction occurring within the last 10 years: No If all of the above answers are "NO", then may proceed with Cephalosporin use.      REVIEW OF SYSTEMS (Negative unless checked)  Constitutional: [] Weight loss  [] Fever  [] Chills Cardiac: [] Chest pain   [] Chest pressure   [] Palpitations   [] Shortness of breath when laying flat   [] Shortness of breath with exertion. Vascular:  [x] Pain in legs with walking   [] Pain in legs at rest  [] History of DVT   [] Phlebitis   [] Swelling in legs   [] Varicose veins   [] Non-healing ulcers Pulmonary:   [] Uses home oxygen   [] Productive cough   [] Hemoptysis   [] Wheeze  [] COPD   [] Asthma Neurologic:  [] Dizziness   [] Seizures   [] History of stroke   [] History of TIA  [] Aphasia   [] Vissual changes   [] Weakness or numbness in arm   [] Weakness or numbness in leg Musculoskeletal:   [x] Joint swelling   [x] Joint pain   [] Low back pain Hematologic:  [] Easy bruising  [] Easy bleeding   [] Hypercoagulable state   [] Anemic Gastrointestinal:  [] Diarrhea   [] Vomiting  [] Gastroesophageal reflux/heartburn   [] Difficulty swallowing. Genitourinary:  [] Chronic kidney disease   [] Difficult urination  [] Frequent urination   [] Blood in urine Skin:  [] Rashes   [] Ulcers  Psychological:  [] History of anxiety   []  History of major depression.  Physical Examination  Vitals:   12/27/17 1356  BP: (!) 153/75  Pulse: 79  Resp: 17  Weight: 70.8 kg (156 lb)  Height: 5\' 7"  (1.702 m)   Body mass index is 24.43 kg/m. Gen: WD/WN, NAD Head: [place]/AT, No temporalis wasting.  Ear/Nose/Throat: Hearing grossly intact, nares w/o erythema or drainage Eyes: PER, EOMI, sclera nonicteric.  Neck: Supple, no large  masses.   Pulmonary:  Good air movement, no audible wheezing bilaterally, no use of accessory muscles.  Cardiac: RRR, no JVD Vascular:  Vessel Right Left  Radial Palpable Palpable  PT Trace Palpable Trace Palpable  DP Trace Palpable Trace Palpable  Gastrointestinal: Non-distended. No guarding/no peritoneal signs.  Musculoskeletal: M/S 5/5 throughout.  No deformity or atrophy.  Neurologic: CN 2-12 intact. Symmetrical.  Speech is fluent. Motor exam as listed above. Psychiatric: Judgment intact, Mood & affect appropriate for pt's clinical situation. Dermatologic: No rashes or ulcers noted.  No changes consistent with cellulitis. Lymph : No lichenification or skin changes of chronic lymphedema.  CBC Lab Results  Component Value Date   WBC 8.0 12/26/2017   HGB 12.8 (L) 12/26/2017   HCT 37.4 (L) 12/26/2017   MCV 101.8 (H) 12/26/2017   PLT 345 12/26/2017    BMET    Component Value Date/Time   NA 137 12/26/2017 1515   NA 137 03/22/2013 0428   K 3.5 12/26/2017 1515   K 3.8  03/22/2013 0428   CL 98 (L) 12/26/2017 1515   CL 102 03/22/2013 0428   CO2 30 12/26/2017 1515   CO2 32 03/22/2013 0428   GLUCOSE 110 (H) 12/26/2017 1515   GLUCOSE 99 03/22/2013 0428   BUN 16 12/26/2017 1515   BUN 15 03/22/2013 0428   CREATININE 0.86 12/26/2017 1515   CREATININE 1.00 03/22/2013 0428   CALCIUM 9.3 12/26/2017 1515   CALCIUM 8.4 (L) 03/22/2013 0428   GFRNONAA >60 12/26/2017 1515   GFRNONAA >60 03/22/2013 0428   GFRAA >60 12/26/2017 1515   GFRAA >60 03/22/2013 0428   Estimated Creatinine Clearance: 68.3 mL/min (by C-G formula based on SCr of 0.86 mg/dL).  COAG Lab Results  Component Value Date   INR 2.79 12/26/2017   INR 1.11 11/14/2016   INR 1.05 08/24/2016    Radiology No results found.  Assessment/Plan 1. PAD (peripheral artery disease) (HCC)  Recommend:  The patient has evidence of atherosclerosis of the lower extremities with claudication.  The patient does not voice  lifestyle limiting changes at this point in time.  Noninvasive studies do not suggest clinically significant change.  No invasive studies, angiography or surgery at this time The patient should continue walking and begin a more formal exercise program.  The patient should continue antiplatelet therapy and aggressive treatment of the lipid abnormalities  No changes in the patient's medications at this time  The patient should continue wearing graduated compression socks 10-15 mmHg strength to control the mild edema.    2. Mesenteric ischemia (HCC)  Recommend:  The patient has evidence of atherosclerosis of the mesenteric arteries.  The patient does not voice lifestyle limiting changes at this point in time.  Noninvasive studies do not suggest clinically significant change.  No invasive studies, angiography or surgery at this time The patient should continue walking and begin a more formal exercise program.  The patient should continue antiplatelet therapy and aggressive treatment of the lipid abnormalities  No changes in the patient's medications at this time  The patient should continue wearing graduated compression socks 10-15 mmHg strength to control the mild edema.    3. Primary osteoarthritis of left knee The patient is cleared to stop his coumadin 5 days before knee replacement and can restart after surgery with a bridge of Lovenox per Dr Marry Guan  4. Mixed hyperlipidemia Continue statin as ordered and reviewed, no changes at this time   5. Essential hypertension Continue antihypertensive medications as already ordered, these medications have been reviewed and there are no changes at this time.     Hortencia Pilar, MD  12/29/2017 10:28 AM

## 2017-12-29 NOTE — Pre-Procedure Instructions (Signed)
Urine culture and UA sent to Dr. Marry Guan for review.  Asked if wanted urine culture recollected as suggested?  Also sent IgE results to Dr. Marry Guan for review.

## 2018-01-06 MED ORDER — TRANEXAMIC ACID 1000 MG/10ML IV SOLN
1000.0000 mg | INTRAVENOUS | Status: DC
  Filled 2018-01-06: qty 10

## 2018-01-06 MED ORDER — CLINDAMYCIN PHOSPHATE 900 MG/50ML IV SOLN: 900.0000 mg | INTRAVENOUS | Status: DC

## 2018-01-06 NOTE — Discharge Instructions (Signed)
°  Instructions after Total Knee Replacement ° ° Elhadji Pecore P. Milton Sagona, Jr., M.D.    ° Dept. of Orthopaedics & Sports Medicine ° [facility] ° [address] ° [place], [place]  [address] ° Phone: [phone]   Fax: [phone] ° °  °DIET: °• Drink plenty of non-alcoholic fluids. °• Resume your normal diet. Include foods high in fiber. ° °ACTIVITY:  °• You may use crutches or a walker with weight-bearing as tolerated, unless instructed otherwise. °• You may be weaned off of the walker or crutches by your Physical Therapist.  °• Do NOT place pillows under the knee. Anything placed under the knee could limit your ability to straighten the knee.   °• Continue doing gentle exercises. Exercising will reduce the pain and swelling, increase motion, and prevent muscle weakness.   °• Please continue to use the TED compression stockings for 6 weeks. You may remove the stockings at night, but should reapply them in the morning. °• Do not drive or operate any equipment until instructed. ° °WOUND CARE:  °• Continue to use the PolarCare or ice packs periodically to reduce pain and swelling. °• You may bathe or shower after the staples are removed at the first office visit following surgery. ° °MEDICATIONS: °• You may resume your regular medications. °• Please take the pain medication as prescribed on the medication. °• Do not take pain medication on an empty stomach. °• You have been given a prescription for a blood thinner (Lovenox or Coumadin). Please take the medication as instructed. (NOTE: After completing a 2 week course of Lovenox, take one Enteric-coated aspirin once a day. This along with elevation will help reduce the possibility of phlebitis in your operated leg.) °• Do not drive or drink alcoholic beverages when taking pain medications. ° °CALL THE OFFICE FOR: °• Temperature above 101 degrees °• Excessive bleeding or drainage on the dressing. °• Excessive swelling, coldness, or paleness of the toes. °• Persistent  nausea and vomiting. ° °FOLLOW-UP:  °• You should have an appointment to return to the office in 10-14 days after surgery. °• Arrangements have been made for continuation of Physical Therapy (either home therapy or outpatient therapy). °  °

## 2018-01-07 ENCOUNTER — Encounter: Payer: Self-pay | Admitting: Orthopedic Surgery

## 2018-01-07 ENCOUNTER — Encounter
Admission: RE | Disposition: A | Discharge status: Home Health | Payer: Self-pay | Source: Ambulatory Visit | Attending: Orthopedic Surgery

## 2018-01-07 ENCOUNTER — Inpatient Hospital Stay
Admission: RE | Admit: 2018-01-07 | Discharge: 2018-01-10 | DRG: 469 | Disposition: A | Discharge status: Home Health | Payer: Medicare PPO | Source: Ambulatory Visit | Attending: Orthopedic Surgery | Admitting: Orthopedic Surgery

## 2018-01-07 ENCOUNTER — Inpatient Hospital Stay: Payer: Medicare PPO | Admitting: Anesthesiology

## 2018-01-07 ENCOUNTER — Inpatient Hospital Stay: Payer: Medicare PPO

## 2018-01-07 ENCOUNTER — Other Ambulatory Visit: Payer: Self-pay

## 2018-01-07 DIAGNOSIS — Z87891 Personal history of nicotine dependence: Secondary | ICD-10-CM | POA: Diagnosis not present

## 2018-01-07 DIAGNOSIS — F10231 Alcohol dependence with withdrawal delirium: Secondary | ICD-10-CM | POA: Diagnosis not present

## 2018-01-07 DIAGNOSIS — Z955 Presence of coronary angioplasty implant and graft: Secondary | ICD-10-CM | POA: Diagnosis not present

## 2018-01-07 DIAGNOSIS — Z8249 Family history of ischemic heart disease and other diseases of the circulatory system: Secondary | ICD-10-CM

## 2018-01-07 DIAGNOSIS — E782 Mixed hyperlipidemia: Secondary | ICD-10-CM | POA: Diagnosis present

## 2018-01-07 DIAGNOSIS — Z23 Encounter for immunization: Secondary | ICD-10-CM | POA: Diagnosis present

## 2018-01-07 DIAGNOSIS — Z95828 Presence of other vascular implants and grafts: Secondary | ICD-10-CM

## 2018-01-07 DIAGNOSIS — I10 Essential (primary) hypertension: Secondary | ICD-10-CM | POA: Diagnosis present

## 2018-01-07 DIAGNOSIS — Z85828 Personal history of other malignant neoplasm of skin: Secondary | ICD-10-CM | POA: Diagnosis not present

## 2018-01-07 DIAGNOSIS — Z880 Allergy status to penicillin: Secondary | ICD-10-CM | POA: Diagnosis not present

## 2018-01-07 DIAGNOSIS — K7030 Alcoholic cirrhosis of liver without ascites: Secondary | ICD-10-CM

## 2018-01-07 DIAGNOSIS — G9349 Other encephalopathy: Secondary | ICD-10-CM | POA: Diagnosis not present

## 2018-01-07 DIAGNOSIS — Z885 Allergy status to narcotic agent status: Secondary | ICD-10-CM | POA: Diagnosis not present

## 2018-01-07 DIAGNOSIS — Z888 Allergy status to other drugs, medicaments and biological substances status: Secondary | ICD-10-CM | POA: Diagnosis not present

## 2018-01-07 DIAGNOSIS — G9340 Encephalopathy, unspecified: Secondary | ICD-10-CM | POA: Diagnosis not present

## 2018-01-07 DIAGNOSIS — M1712 Unilateral primary osteoarthritis, left knee: Principal | ICD-10-CM | POA: Diagnosis present

## 2018-01-07 DIAGNOSIS — I4892 Unspecified atrial flutter: Secondary | ICD-10-CM | POA: Diagnosis present

## 2018-01-07 DIAGNOSIS — M25762 Osteophyte, left knee: Secondary | ICD-10-CM | POA: Diagnosis present

## 2018-01-07 DIAGNOSIS — Z7901 Long term (current) use of anticoagulants: Secondary | ICD-10-CM | POA: Diagnosis not present

## 2018-01-07 DIAGNOSIS — D6869 Other thrombophilia: Secondary | ICD-10-CM | POA: Diagnosis present

## 2018-01-07 DIAGNOSIS — J9601 Acute respiratory failure with hypoxia: Secondary | ICD-10-CM | POA: Diagnosis not present

## 2018-01-07 DIAGNOSIS — E876 Hypokalemia: Secondary | ICD-10-CM | POA: Diagnosis present

## 2018-01-07 DIAGNOSIS — M25562 Pain in left knee: Secondary | ICD-10-CM | POA: Diagnosis present

## 2018-01-07 DIAGNOSIS — Z79899 Other long term (current) drug therapy: Secondary | ICD-10-CM | POA: Diagnosis not present

## 2018-01-07 DIAGNOSIS — Z96659 Presence of unspecified artificial knee joint: Secondary | ICD-10-CM

## 2018-01-07 DIAGNOSIS — R069 Unspecified abnormalities of breathing: Secondary | ICD-10-CM

## 2018-01-07 DIAGNOSIS — J441 Chronic obstructive pulmonary disease with (acute) exacerbation: Secondary | ICD-10-CM | POA: Diagnosis not present

## 2018-01-07 DIAGNOSIS — I70213 Atherosclerosis of native arteries of extremities with intermittent claudication, bilateral legs: Secondary | ICD-10-CM | POA: Diagnosis present

## 2018-01-07 DIAGNOSIS — I340 Nonrheumatic mitral (valve) insufficiency: Secondary | ICD-10-CM | POA: Diagnosis present

## 2018-01-07 DIAGNOSIS — J9600 Acute respiratory failure, unspecified whether with hypoxia or hypercapnia: Secondary | ICD-10-CM

## 2018-01-07 HISTORY — PX: KNEE ARTHROPLASTY: SHX992

## 2018-01-07 LAB — ABO/RH: ABO/RH(D): O POS

## 2018-01-07 LAB — PROTIME-INR
INR: 1.07
Prothrombin Time: 13.8 seconds (ref 11.4–15.2)

## 2018-01-07 SURGERY — ARTHROPLASTY, KNEE, TOTAL, USING IMAGELESS COMPUTER-ASSISTED NAVIGATION
Anesthesia: Spinal | Laterality: Left

## 2018-01-07 MED ORDER — WARFARIN SODIUM 2 MG PO TABS
2.0000 mg | ORAL_TABLET | Freq: Every day | ORAL | Status: DC
  Administered 2018-01-07 – 2018-01-09 (×2): 2 mg via ORAL
  Filled 2018-01-07 (×4): qty 1

## 2018-01-07 MED ORDER — BUPIVACAINE HCL (PF) 0.25 % IJ SOLN
INTRAMUSCULAR | Status: DC | PRN
  Administered 2018-01-07: 60 mL

## 2018-01-07 MED ORDER — PROPOFOL 500 MG/50ML IV EMUL
INTRAVENOUS | Status: AC
  Filled 2018-01-07: qty 50

## 2018-01-07 MED ORDER — PNEUMOCOCCAL VAC POLYVALENT 25 MCG/0.5ML IJ INJ
0.5000 mL | INJECTION | INTRAMUSCULAR | Status: AC
  Administered 2018-01-08: 0.5 mL via INTRAMUSCULAR
  Filled 2018-01-07: qty 0.5

## 2018-01-07 MED ORDER — NEOMYCIN-POLYMYXIN B GU 40-200000 IR SOLN
Status: DC | PRN
  Administered 2018-01-07: 14 mL

## 2018-01-07 MED ORDER — SODIUM CHLORIDE 0.9 % IV SOLN
INTRAVENOUS | Status: DC | PRN
  Administered 2018-01-07: 60 mL

## 2018-01-07 MED ORDER — BUPIVACAINE HCL (PF) 0.5 % IJ SOLN
INTRAMUSCULAR | Status: DC | PRN
  Administered 2018-01-07: 3 mL

## 2018-01-07 MED ORDER — SODIUM CHLORIDE 0.9 % IV SOLN
INTRAVENOUS | Status: DC | PRN
  Administered 2018-01-07: 20 ug/min via INTRAVENOUS

## 2018-01-07 MED ORDER — ACETAMINOPHEN 325 MG PO TABS
650.0000 mg | ORAL_TABLET | ORAL | Status: DC | PRN
Start: 2018-01-07 — End: 2018-01-10

## 2018-01-07 MED ORDER — WARFARIN - PHARMACIST DOSING INPATIENT: Freq: Every day | Status: DC

## 2018-01-07 MED ORDER — MAGNESIUM HYDROXIDE 400 MG/5ML PO SUSP
30.0000 mL | Freq: Every day | ORAL | Status: DC | PRN
  Administered 2018-01-10: 30 mL via ORAL
  Filled 2018-01-07: qty 30

## 2018-01-07 MED ORDER — MENTHOL 3 MG MT LOZG
1.0000 | LOZENGE | OROMUCOSAL | Status: DC | PRN
  Administered 2018-01-08: 3 mg via ORAL
  Filled 2018-01-07 (×3): qty 9

## 2018-01-07 MED ORDER — ATORVASTATIN CALCIUM 20 MG PO TABS
20.0000 mg | ORAL_TABLET | Freq: Every day | ORAL | Status: DC
  Administered 2018-01-08 – 2018-01-10 (×2): 20 mg via ORAL
  Filled 2018-01-07 (×2): qty 1

## 2018-01-07 MED ORDER — TAPENTADOL HCL 50 MG PO TABS
50.0000 mg | ORAL_TABLET | ORAL | Status: DC | PRN
  Administered 2018-01-07 – 2018-01-08 (×3): 50 mg via ORAL
  Filled 2018-01-07 (×3): qty 1

## 2018-01-07 MED ORDER — GABAPENTIN 300 MG PO CAPS
300.0000 mg | ORAL_CAPSULE | Freq: Once | ORAL | Status: AC
  Administered 2018-01-07: 300 mg via ORAL

## 2018-01-07 MED ORDER — TAPENTADOL HCL 75 MG PO TABS: 75.0000 mg | ORAL_TABLET | ORAL | Status: DC | PRN

## 2018-01-07 MED ORDER — ONDANSETRON HCL 4 MG/2ML IJ SOLN: 4.0000 mg | Freq: Once | INTRAMUSCULAR | Status: DC | PRN

## 2018-01-07 MED ORDER — BISACODYL 10 MG RE SUPP: 10.0000 mg | Freq: Every day | RECTAL | Status: DC | PRN

## 2018-01-07 MED ORDER — DEXMEDETOMIDINE HCL 200 MCG/2ML IV SOLN
INTRAVENOUS | Status: DC | PRN
  Administered 2018-01-07: 12 ug via INTRAVENOUS

## 2018-01-07 MED ORDER — PHENOL 1.4 % MT LIQD
1.0000 | OROMUCOSAL | Status: DC | PRN
  Filled 2018-01-07 (×3): qty 177

## 2018-01-07 MED ORDER — PROPOFOL 500 MG/50ML IV EMUL
INTRAVENOUS | Status: DC | PRN
  Administered 2018-01-07: 50 ug/kg/min via INTRAVENOUS

## 2018-01-07 MED ORDER — FENTANYL CITRATE (PF) 100 MCG/2ML IJ SOLN
INTRAMUSCULAR | Status: AC
  Filled 2018-01-07: qty 2

## 2018-01-07 MED ORDER — MORPHINE SULFATE (PF) 2 MG/ML IV SOLN: 2.0000 mg | INTRAVENOUS | Status: DC | PRN

## 2018-01-07 MED ORDER — PROPOFOL 10 MG/ML IV BOLUS
INTRAVENOUS | Status: AC
  Filled 2018-01-07: qty 20

## 2018-01-07 MED ORDER — HYDROCORTISONE 2.5 % EX CREA
TOPICAL_CREAM | Freq: Two times a day (BID) | CUTANEOUS | Status: DC | PRN
Start: 2018-01-07 — End: 2018-01-10
  Filled 2018-01-07: qty 30

## 2018-01-07 MED ORDER — PROPOFOL 10 MG/ML IV BOLUS
INTRAVENOUS | Status: AC
Start: 2018-01-07 — End: 2018-01-07
  Filled 2018-01-07: qty 20

## 2018-01-07 MED ORDER — LIDOCAINE HCL (PF) 2 % IJ SOLN
INTRAMUSCULAR | Status: AC
  Filled 2018-01-07: qty 10

## 2018-01-07 MED ORDER — FENTANYL CITRATE (PF) 100 MCG/2ML IJ SOLN
25.0000 ug | INTRAMUSCULAR | Status: DC | PRN
  Administered 2018-01-07: 25 ug via INTRAVENOUS

## 2018-01-07 MED ORDER — ACETAMINOPHEN 650 MG RE SUPP: 650.0000 mg | RECTAL | Status: DC | PRN

## 2018-01-07 MED ORDER — GLYCOPYRROLATE 0.2 MG/ML IJ SOLN
INTRAMUSCULAR | Status: AC
  Filled 2018-01-07: qty 1

## 2018-01-07 MED ORDER — ONDANSETRON HCL 4 MG PO TABS: 4.0000 mg | ORAL_TABLET | Freq: Four times a day (QID) | ORAL | Status: DC | PRN

## 2018-01-07 MED ORDER — FAMOTIDINE 20 MG PO TABS
20.0000 mg | ORAL_TABLET | Freq: Once | ORAL | Status: AC
  Administered 2018-01-07: 20 mg via ORAL

## 2018-01-07 MED ORDER — TRAMADOL HCL 50 MG PO TABS
50.0000 mg | ORAL_TABLET | ORAL | Status: DC | PRN
  Administered 2018-01-07: 50 mg via ORAL
  Administered 2018-01-07: 100 mg via ORAL
  Filled 2018-01-07: qty 1
  Filled 2018-01-07: qty 2

## 2018-01-07 MED ORDER — PANTOPRAZOLE SODIUM 40 MG PO TBEC
40.0000 mg | DELAYED_RELEASE_TABLET | Freq: Two times a day (BID) | ORAL | Status: DC
  Administered 2018-01-07 – 2018-01-10 (×5): 40 mg via ORAL
  Filled 2018-01-07 (×5): qty 1

## 2018-01-07 MED ORDER — SODIUM CHLORIDE 0.9 % IJ SOLN
INTRAMUSCULAR | Status: DC | PRN
  Administered 2018-01-07: 40 mL via INTRAVENOUS

## 2018-01-07 MED ORDER — ACETAMINOPHEN 10 MG/ML IV SOLN
INTRAVENOUS | Status: DC | PRN
  Administered 2018-01-07: 1000 mg via INTRAVENOUS

## 2018-01-07 MED ORDER — SODIUM CHLORIDE 0.9 % IV SOLN
INTRAVENOUS | Status: DC
  Administered 2018-01-07 – 2018-01-08 (×2): via INTRAVENOUS

## 2018-01-07 MED ORDER — GLYCOPYRROLATE 0.2 MG/ML IJ SOLN
INTRAMUSCULAR | Status: DC | PRN
  Administered 2018-01-07: 0.2 mg via INTRAVENOUS

## 2018-01-07 MED ORDER — FAMOTIDINE 20 MG PO TABS
ORAL_TABLET | ORAL | Status: AC
  Administered 2018-01-07: 20 mg via ORAL
  Filled 2018-01-07: qty 1

## 2018-01-07 MED ORDER — FLEET ENEMA 7-19 GM/118ML RE ENEM: 1.0000 | ENEMA | Freq: Once | RECTAL | Status: DC | PRN

## 2018-01-07 MED ORDER — LACTATED RINGERS IV SOLN
INTRAVENOUS | Status: DC
  Administered 2018-01-07 (×2): via INTRAVENOUS

## 2018-01-07 MED ORDER — ACETAMINOPHEN 10 MG/ML IV SOLN: INTRAVENOUS | Status: DC | PRN

## 2018-01-07 MED ORDER — ACETAMINOPHEN 10 MG/ML IV SOLN
1000.0000 mg | Freq: Four times a day (QID) | INTRAVENOUS | Status: AC
  Administered 2018-01-07 – 2018-01-08 (×4): 1000 mg via INTRAVENOUS
  Filled 2018-01-07 (×4): qty 100

## 2018-01-07 MED ORDER — CELECOXIB 200 MG PO CAPS
200.0000 mg | ORAL_CAPSULE | Freq: Two times a day (BID) | ORAL | Status: DC
  Administered 2018-01-07 – 2018-01-10 (×5): 200 mg via ORAL
  Filled 2018-01-07 (×7): qty 1

## 2018-01-07 MED ORDER — SODIUM CHLORIDE 0.9 % IV SOLN
INTRAVENOUS | Status: DC | PRN
  Administered 2018-01-07: 1000 mg via INTRAVENOUS

## 2018-01-07 MED ORDER — MIDAZOLAM HCL 5 MG/5ML IJ SOLN
INTRAMUSCULAR | Status: DC | PRN
  Administered 2018-01-07: 0.5 mg via INTRAVENOUS

## 2018-01-07 MED ORDER — TRANEXAMIC ACID 1000 MG/10ML IV SOLN
1000.0000 mg | Freq: Once | INTRAVENOUS | Status: AC
  Administered 2018-01-07: 1000 mg via INTRAVENOUS
  Filled 2018-01-07: qty 10

## 2018-01-07 MED ORDER — ACETAMINOPHEN 10 MG/ML IV SOLN
INTRAVENOUS | Status: AC
  Filled 2018-01-07: qty 100

## 2018-01-07 MED ORDER — DILTIAZEM HCL 30 MG PO TABS
120.0000 mg | ORAL_TABLET | Freq: Every day | ORAL | Status: DC
  Administered 2018-01-07 – 2018-01-10 (×4): 120 mg via ORAL
  Filled 2018-01-07: qty 4
  Filled 2018-01-07: qty 2
  Filled 2018-01-07 (×2): qty 4

## 2018-01-07 MED ORDER — CLINDAMYCIN PHOSPHATE 600 MG/50ML IV SOLN
600.0000 mg | Freq: Four times a day (QID) | INTRAVENOUS | Status: AC
  Administered 2018-01-07 – 2018-01-08 (×4): 600 mg via INTRAVENOUS
  Filled 2018-01-07 (×4): qty 50

## 2018-01-07 MED ORDER — DIPHENHYDRAMINE HCL 12.5 MG/5ML PO ELIX
12.5000 mg | ORAL_SOLUTION | ORAL | Status: DC | PRN
  Filled 2018-01-07: qty 10

## 2018-01-07 MED ORDER — MIDAZOLAM HCL 2 MG/2ML IJ SOLN
INTRAMUSCULAR | Status: AC
  Filled 2018-01-07: qty 2

## 2018-01-07 MED ORDER — CLINDAMYCIN PHOSPHATE 900 MG/50ML IV SOLN
INTRAVENOUS | Status: DC | PRN
  Administered 2018-01-07: 900 mg via INTRAVENOUS

## 2018-01-07 MED ORDER — CELECOXIB 200 MG PO CAPS
ORAL_CAPSULE | ORAL | Status: AC
  Administered 2018-01-07: 200 mg
  Filled 2018-01-07: qty 2

## 2018-01-07 MED ORDER — METOCLOPRAMIDE HCL 10 MG PO TABS
10.0000 mg | ORAL_TABLET | Freq: Three times a day (TID) | ORAL | Status: AC
  Administered 2018-01-07 – 2018-01-09 (×6): 10 mg via ORAL
  Filled 2018-01-07 (×8): qty 1

## 2018-01-07 MED ORDER — GABAPENTIN 300 MG PO CAPS
300.0000 mg | ORAL_CAPSULE | Freq: Every day | ORAL | Status: DC
  Administered 2018-01-07 – 2018-01-09 (×2): 300 mg via ORAL
  Filled 2018-01-07 (×3): qty 1

## 2018-01-07 MED ORDER — CLINDAMYCIN PHOSPHATE 900 MG/50ML IV SOLN
INTRAVENOUS | Status: AC
  Filled 2018-01-07: qty 50

## 2018-01-07 MED ORDER — CHLORHEXIDINE GLUCONATE 4 % EX LIQD: 60.0000 mL | Freq: Once | CUTANEOUS | Status: DC

## 2018-01-07 MED ORDER — GABAPENTIN 300 MG PO CAPS
ORAL_CAPSULE | ORAL | Status: AC
  Administered 2018-01-07: 300 mg via ORAL
  Filled 2018-01-07: qty 1

## 2018-01-07 MED ORDER — ENOXAPARIN SODIUM 30 MG/0.3ML ~~LOC~~ SOLN
30.0000 mg | Freq: Two times a day (BID) | SUBCUTANEOUS | Status: DC
  Administered 2018-01-08 – 2018-01-10 (×5): 30 mg via SUBCUTANEOUS
  Filled 2018-01-07 (×5): qty 0.3

## 2018-01-07 MED ORDER — FERROUS SULFATE 325 (65 FE) MG PO TABS
325.0000 mg | ORAL_TABLET | Freq: Two times a day (BID) | ORAL | Status: DC
  Administered 2018-01-08 – 2018-01-10 (×3): 325 mg via ORAL
  Filled 2018-01-07 (×3): qty 1

## 2018-01-07 MED ORDER — ATENOLOL 25 MG PO TABS
25.0000 mg | ORAL_TABLET | Freq: Every day | ORAL | Status: DC
  Administered 2018-01-08 – 2018-01-10 (×3): 25 mg via ORAL
  Filled 2018-01-07 (×3): qty 1

## 2018-01-07 MED ORDER — LISINOPRIL 20 MG PO TABS
20.0000 mg | ORAL_TABLET | Freq: Every day | ORAL | Status: DC
  Administered 2018-01-07 – 2018-01-08 (×2): 20 mg via ORAL
  Filled 2018-01-07 (×2): qty 1

## 2018-01-07 MED ORDER — FENTANYL CITRATE (PF) 100 MCG/2ML IJ SOLN
INTRAMUSCULAR | Status: AC
  Administered 2018-01-07: 25 ug via INTRAVENOUS
  Filled 2018-01-07: qty 2

## 2018-01-07 MED ORDER — ALUM & MAG HYDROXIDE-SIMETH 200-200-20 MG/5ML PO SUSP
30.0000 mL | ORAL | Status: DC | PRN
  Filled 2018-01-07: qty 30

## 2018-01-07 MED ORDER — PHENYLEPHRINE HCL 10 MG/ML IJ SOLN
INTRAMUSCULAR | Status: AC
  Filled 2018-01-07: qty 1

## 2018-01-07 MED ORDER — SENNOSIDES-DOCUSATE SODIUM 8.6-50 MG PO TABS
1.0000 | ORAL_TABLET | Freq: Two times a day (BID) | ORAL | Status: DC
  Administered 2018-01-07 – 2018-01-10 (×4): 1 via ORAL
  Filled 2018-01-07 (×5): qty 1

## 2018-01-07 MED ORDER — ONDANSETRON HCL 4 MG/2ML IJ SOLN
4.0000 mg | Freq: Four times a day (QID) | INTRAMUSCULAR | Status: DC | PRN
  Administered 2018-01-09: 4 mg via INTRAVENOUS
  Filled 2018-01-07: qty 2

## 2018-01-07 MED ORDER — PROPOFOL 10 MG/ML IV BOLUS
INTRAVENOUS | Status: DC | PRN
  Administered 2018-01-07 (×2): 14 mg via INTRAVENOUS
  Administered 2018-01-07: 30 mg via INTRAVENOUS
  Administered 2018-01-07: 14 mg via INTRAVENOUS
  Administered 2018-01-07: 20 mg via INTRAVENOUS
  Administered 2018-01-07: 14 mg via INTRAVENOUS

## 2018-01-07 MED ORDER — FENTANYL CITRATE (PF) 100 MCG/2ML IJ SOLN
INTRAMUSCULAR | Status: DC | PRN
  Administered 2018-01-07 (×2): 50 ug via INTRAVENOUS

## 2018-01-07 SURGICAL SUPPLY — 64 items
BATTERY INSTRU NAVIGATION (MISCELLANEOUS) ×12 IMPLANT
BLADE SAW 1 (BLADE) ×3 IMPLANT
BLADE SAW 1/2 (BLADE) ×3 IMPLANT
BLADE SAW 70X12.5 (BLADE) IMPLANT
BONE CEMENT GENTAMICIN (Cement) ×6 IMPLANT
CANISTER SUCT 1200ML W/VALVE (MISCELLANEOUS) ×3 IMPLANT
CANISTER SUCT 3000ML PPV (MISCELLANEOUS) ×6 IMPLANT
CAPT KNEE TOTAL 3 ATTUNE ×3 IMPLANT
CEMENT BONE GENTAMICIN 40 (Cement) ×2 IMPLANT
COOLER POLAR GLACIER W/PUMP (MISCELLANEOUS) ×3 IMPLANT
CUFF TOURN 24 STER (MISCELLANEOUS) ×3 IMPLANT
CUFF TOURN 30 STER DUAL PORT (MISCELLANEOUS) IMPLANT
DRAPE SHEET LG 3/4 BI-LAMINATE (DRAPES) ×3 IMPLANT
DRSG DERMACEA 8X12 NADH (GAUZE/BANDAGES/DRESSINGS) ×3 IMPLANT
DRSG OPSITE POSTOP 4X14 (GAUZE/BANDAGES/DRESSINGS) ×3 IMPLANT
DRSG TEGADERM 4X4.75 (GAUZE/BANDAGES/DRESSINGS) ×3 IMPLANT
DURAPREP 26ML APPLICATOR (WOUND CARE) ×6 IMPLANT
ELECT CAUTERY BLADE 6.4 (BLADE) ×3 IMPLANT
ELECT REM PT RETURN 9FT ADLT (ELECTROSURGICAL) ×3
ELECTRODE REM PT RTRN 9FT ADLT (ELECTROSURGICAL) ×1 IMPLANT
EVACUATOR 1/8 PVC DRAIN (DRAIN) ×3 IMPLANT
EX-PIN ORTHOLOCK NAV 4X150 (PIN) ×6 IMPLANT
GLOVE BIOGEL M STRL SZ7.5 (GLOVE) ×6 IMPLANT
GLOVE BIOGEL PI IND STRL 9 (GLOVE) ×1 IMPLANT
GLOVE BIOGEL PI INDICATOR 9 (GLOVE) ×2
GLOVE INDICATOR 8.0 STRL GRN (GLOVE) ×3 IMPLANT
GLOVE SURG SYN 9.0  PF PI (GLOVE) ×2
GLOVE SURG SYN 9.0 PF PI (GLOVE) ×1 IMPLANT
GOWN STRL REUS W/ TWL LRG LVL3 (GOWN DISPOSABLE) ×2 IMPLANT
GOWN STRL REUS W/TWL 2XL LVL3 (GOWN DISPOSABLE) ×3 IMPLANT
GOWN STRL REUS W/TWL LRG LVL3 (GOWN DISPOSABLE) ×4
HOLDER FOLEY CATH W/STRAP (MISCELLANEOUS) ×3 IMPLANT
HOOD PEEL AWAY FLYTE STAYCOOL (MISCELLANEOUS) ×6 IMPLANT
KIT RM TURNOVER STRD PROC AR (KITS) ×3 IMPLANT
KNIFE SCULPS 14X20 (INSTRUMENTS) ×3 IMPLANT
LABEL OR SOLS (LABEL) ×3 IMPLANT
NDL SAFETY ECLIPSE 18X1.5 (NEEDLE) ×1 IMPLANT
NEEDLE HYPO 18GX1.5 SHARP (NEEDLE) ×2
NEEDLE SPNL 20GX3.5 QUINCKE YW (NEEDLE) ×6 IMPLANT
NS IRRIG 500ML POUR BTL (IV SOLUTION) ×3 IMPLANT
PACK TOTAL KNEE (MISCELLANEOUS) ×3 IMPLANT
PAD WRAPON POLAR KNEE (MISCELLANEOUS) ×1 IMPLANT
PIN DRILL QUICK PACK ×6 IMPLANT
PIN FIXATION 1/8DIA X 3INL (PIN) ×3 IMPLANT
PULSAVAC PLUS IRRIG FAN TIP (DISPOSABLE) ×3
SOL .9 NS 3000ML IRR  AL (IV SOLUTION) ×2
SOL .9 NS 3000ML IRR UROMATIC (IV SOLUTION) ×1 IMPLANT
SOL PREP PVP 2OZ (MISCELLANEOUS) ×3
SOLUTION PREP PVP 2OZ (MISCELLANEOUS) ×1 IMPLANT
SPONGE DRAIN TRACH 4X4 STRL 2S (GAUZE/BANDAGES/DRESSINGS) ×3 IMPLANT
STAPLER SKIN PROX 35W (STAPLE) ×3 IMPLANT
STRAP TIBIA SHORT (MISCELLANEOUS) ×3 IMPLANT
SUCTION FRAZIER HANDLE 10FR (MISCELLANEOUS) ×2
SUCTION TUBE FRAZIER 10FR DISP (MISCELLANEOUS) ×1 IMPLANT
SUT VIC AB 0 CT1 36 (SUTURE) ×3 IMPLANT
SUT VIC AB 1 CT1 36 (SUTURE) ×6 IMPLANT
SUT VIC AB 2-0 CT2 27 (SUTURE) ×3 IMPLANT
SYR 20CC LL (SYRINGE) ×3 IMPLANT
SYR 30ML LL (SYRINGE) ×6 IMPLANT
TIP FAN IRRIG PULSAVAC PLUS (DISPOSABLE) ×1 IMPLANT
TOWEL OR 17X26 4PK STRL BLUE (TOWEL DISPOSABLE) ×3 IMPLANT
TOWER CARTRIDGE SMART MIX (DISPOSABLE) ×3 IMPLANT
TRAY FOLEY W/METER SILVER 16FR (SET/KITS/TRAYS/PACK) ×3 IMPLANT
WRAPON POLAR PAD KNEE (MISCELLANEOUS) ×3

## 2018-01-07 NOTE — Progress Notes (Signed)
  ANTICOAGULATION CONSULT NOTE - Initial Consult  Pharmacy Consult for warfarin Indication: VTE ppx/atrial flutter per Dr. Nehemiah Massed  Allergies  Allergen Reactions  . Eliquis [Apixaban] Other (See Comments)    causes nose bleeds  . Oxycodone-Acetaminophen Other (See Comments)    hallucinations  . Penicillins Rash    Childhood allergy Has patient had a PCN reaction causing immediate rash, facial/tongue/throat swelling, SOB or lightheadedness with hypotension: No Has patient had a PCN reaction causing severe rash involving mucus membranes or skin necrosis: No Has patient had a PCN reaction that required hospitalization: No Has patient had a PCN reaction occurring within the last 10 years: No If all of the above answers are "NO", then may proceed with Cephalosporin use.     Patient Measurements: Height: 5\' 7"  (170.2 cm) Weight: 156 lb (70.8 kg) IBW/kg (Calculated) : 66.1 Heparin Dosing Weight:   Vital Signs: Temp: 97.6 F (36.4 C) (01/21 1612) Temp Source: Oral (01/21 1612) BP: 161/81 (01/21 1612) Pulse Rate: 73 (01/21 1612)  Labs: No results for input(s): HGB, HCT, PLT, APTT, LABPROT, INR, HEPARINUNFRC, HEPRLOWMOCWT, CREATININE, CKTOTAL, CKMB, TROPONINI in the last 72 hours.  Estimated Creatinine Clearance: 68.3 mL/min (by C-G formula based on SCr of 0.86 mg/dL).   Medical History: Past Medical History:  Diagnosis Date  . COPD (chronic obstructive pulmonary disease) ([place])   . HLD (hyperlipidemia)   . Hypertension   . PVD (peripheral vascular disease) ([place])   . Vomiting    and abd pain    Medications:  Infusions:  . sodium chloride    . acetaminophen    . clindamycin (CLEOCIN) IV      Assessment: 32 yom now s/p left TKA. Uses VKA PTA for atrial flutter. Pharmacy consulted to dose VKA for VTE ppx/to continue PTA dosing.  Goal of Therapy:  INR 2-3 Monitor platelets by anticoagulation protocol: Yes   Plan:  Baseline INR 1.07.  resume PTA dose 2 mg po once  daily. Will follow INR daily until therapeutic x 2.  Laural Benes, Pharm.D., BCPS Clinical Pharmacist 01/07/2018,4:31 PM

## 2018-01-07 NOTE — Progress Notes (Signed)
Pt arrived to unit from PACU. Skin assessment completed with Serenity, RN. Polar care running. Bone foam and immobilizer applied. NS infusing at 100 ml/hr. Pt on room air. Phone call and call bell within reach. Pt oriented to unit. Family at bedside. PRN pain medication given for left knee pain. Will continue to monitor.

## 2018-01-07 NOTE — Anesthesia Procedure Notes (Signed)
Procedure Name: LMA Insertion Date/Time: 01/07/2018 12:42 PM Performed by: Johnna Acosta, CRNA Pre-anesthesia Checklist: Patient identified, Emergency Drugs available, Suction available and Patient being monitored Patient Re-evaluated:Patient Re-evaluated prior to induction Oxygen Delivery Method: Circle system utilized Preoxygenation: Pre-oxygenation with 100% oxygen LMA: LMA inserted LMA Size: 4.5 Tube type: Oral Number of attempts: 1 Placement Confirmation: positive ETCO2 and breath sounds checked- equal and bilateral Tube secured with: Tape Dental Injury: Teeth and Oropharynx as per pre-operative assessment

## 2018-01-07 NOTE — Anesthesia Post-op Follow-up Note (Signed)
Anesthesia QCDR form completed.        

## 2018-01-07 NOTE — Anesthesia Preprocedure Evaluation (Signed)
Anesthesia Evaluation  Patient identified by MRN, date of birth, ID band Patient awake    Reviewed: Allergy & Precautions, NPO status , Patient's Chart, lab work & pertinent test results  History of Anesthesia Complications Negative for: history of anesthetic complications  Airway Mallampati: II       Dental   Pulmonary neg sleep apnea, COPD,  COPD inhaler, former smoker,           Cardiovascular hypertension, Pt. on medications + Peripheral Vascular Disease  (-) CHF (-) dysrhythmias (-) Valvular Problems/Murmurs     Neuro/Psych neg Seizures    GI/Hepatic Neg liver ROS, neg GERD  ,  Endo/Other  neg diabetes  Renal/GU negative Renal ROS     Musculoskeletal   Abdominal   Peds  Hematology  (+) anemia ,   Anesthesia Other Findings   Reproductive/Obstetrics                             Anesthesia Physical Anesthesia Plan  ASA: II  Anesthesia Plan: Spinal   Post-op Pain Management:    Induction:   PONV Risk Score and Plan:   Airway Management Planned: Nasal Cannula  Additional Equipment:   Intra-op Plan:   Post-operative Plan:   Informed Consent: I have reviewed the patients History and Physical, chart, labs and discussed the procedure including the risks, benefits and alternatives for the proposed anesthesia with the patient or authorized representative who has indicated his/her understanding and acceptance.     Plan Discussed with:   Anesthesia Plan Comments:         Anesthesia Quick Evaluation

## 2018-01-07 NOTE — H&P (Signed)
The patient has been re-examined, and the chart reviewed, and there have been no interval changes to the documented history and physical.    The risks, benefits, and alternatives have been discussed at length. The patient expressed understanding of the risks benefits and agreed with plans for surgical intervention.  James P. Hooten, Jr. M.D.    

## 2018-01-07 NOTE — Anesthesia Procedure Notes (Signed)
Spinal  Patient location during procedure: OR Start time: 01/07/2018 11:41 AM End time: 01/07/2018 11:48 AM Staffing Performed: resident/CRNA  Preanesthetic Checklist Completed: patient identified, site marked, surgical consent, pre-op evaluation, timeout performed, IV checked, risks and benefits discussed and monitors and equipment checked Spinal Block Patient position: sitting Prep: ChloraPrep Patient monitoring: heart rate, continuous pulse ox, blood pressure and cardiac monitor Approach: midline Location: L4-5 Injection technique: single-shot Needle Needle type: Introducer and Pencan  Needle gauge: 24 G Needle length: 9 cm Additional Notes Negative paresthesia. Negative blood return. Positive free-flowing CSF. Expiration date of kit checked and confirmed. Patient tolerated procedure well, without complications.

## 2018-01-07 NOTE — Op Note (Signed)
OPERATIVE NOTE  DATE OF SURGERY:  01/07/2018  PATIENT NAME:  [name]   DOB: Oct 30, 1941  MRN: [id number]  PRE-OPERATIVE DIAGNOSIS: Degenerative arthrosis of the left knee, primary  POST-OPERATIVE DIAGNOSIS:  Same  PROCEDURE:  Left total knee arthroplasty using computer-assisted navigation  SURGEON:  Marciano Sequin. M.D.  ASSISTANT:  Vance Peper, PA (present and scrubbed throughout the case, critical for assistance with exposure, retraction, instrumentation, and closure)  ANESTHESIA: spinal  ESTIMATED BLOOD LOSS: 50 mL  FLUIDS REPLACED: 1300 mL of crystalloid  TOURNIQUET TIME: 105 minutes  DRAINS: 2 medium Hemovac drains  SOFT TISSUE RELEASES: Anterior cruciate ligament, posterior cruciate ligament, deep and superficial medial collateral ligament, patellofemoral ligament  IMPLANTS UTILIZED: DePuy Attune size 6 posterior stabilized femoral component (cemented), size 7 rotating platform tibial component (cemented), 38 mm medialized dome patella (cemented), and a 5 mm stabilized rotating platform polyethylene insert.  INDICATIONS FOR SURGERY: [name] is a 77 y.o. year old male with a long history of progressive knee pain. X-rays demonstrated severe degenerative changes in tricompartmental fashion. The patient had not seen any significant improvement despite conservative nonsurgical intervention. After discussion of the risks and benefits of surgical intervention, the patient expressed understanding of the risks benefits and agree with plans for total knee arthroplasty.   The risks, benefits, and alternatives were discussed at length including but not limited to the risks of infection, bleeding, nerve injury, stiffness, blood clots, the need for revision surgery, cardiopulmonary complications, among others, and they were willing to proceed.  PROCEDURE IN DETAIL: The patient was brought into the operating room and, after adequate spinal anesthesia was achieved, a  tourniquet was placed on the patient's upper thigh. The patient's knee and leg were cleaned and prepped with alcohol and DuraPrep and draped in the usual sterile fashion. A "timeout" was performed as per usual protocol. The lower extremity was exsanguinated using an Esmarch, and the tourniquet was inflated to 300 mmHg. An anterior longitudinal incision was made followed by a standard mid vastus approach. The deep fibers of the medial collateral ligament were elevated in a subperiosteal fashion off of the medial flare of the tibia so as to maintain a continuous soft tissue sleeve. The patella was subluxed laterally and the patellofemoral ligament was incised. Inspection of the knee demonstrated severe degenerative changes with full-thickness loss of articular cartilage. Osteophytes were debrided using a rongeur. Anterior and posterior cruciate ligaments were excised. Two 4.0 mm Schanz pins were inserted in the femur and into the tibia for attachment of the array of trackers used for computer-assisted navigation. Hip center was identified using a circumduction technique. Distal landmarks were mapped using the computer. The distal femur and proximal tibia were mapped using the computer. The distal femoral cutting guide was positioned using computer-assisted navigation so as to achieve a 5 distal valgus cut. The femur was sized and it was felt that a size 6 femoral component was appropriate. A size 6 femoral cutting guide was positioned and the anterior cut was performed and verified using the computer. This was followed by completion of the posterior and chamfer cuts. Femoral cutting guide for the central box was then positioned in the center box cut was performed.  Attention was then directed to the proximal tibia. Medial and lateral menisci were excised. The extramedullary tibial cutting guide was positioned using computer-assisted navigation so as to achieve a 0 varus-valgus alignment and 3 posterior slope. The  cut was performed and verified using the computer. The  proximal tibia was sized and it was felt that a size 7 tibial tray was appropriate. Tibial and femoral trials were inserted followed by insertion of a 5 mm polyethylene insert. The knee was felt to be tight medially. A Cobb elevator was used to elevate the superficial fibers of the medial collateral ligament.  This allowed for excellent mediolateral soft tissue balancing both in flexion and in full extension. Finally, the patella was cut and prepared so as to accommodate a 38 mm medialized dome patella. A patella trial was placed and the knee was placed through a range of motion with excellent patellar tracking appreciated. The femoral trial was removed after debridement of posterior osteophytes. The central post-hole for the tibial component was reamed followed by insertion of a keel punch. Tibial trials were then removed. Cut surfaces of bone were irrigated with copious amounts of normal saline with antibiotic solution using pulsatile lavage and then suctioned dry. Polymethylmethacrylate cement with gentamicin was prepared in the usual fashion using a vacuum mixer. Cement was applied to the cut surface of the proximal tibia as well as along the undersurface of a size 7 rotating platform tibial component. Tibial component was positioned and impacted into place. Excess cement was removed using [employer] fast streamer. Cement was then applied to the cut surfaces of the femur as well as along the posterior flanges of the size 6 femoral component. The femoral component was positioned and impacted into place. Excess cement was removed using [employer] fast streamer. A 5 mm polyethylene trial was inserted and the knee was brought into full extension with steady axial compression applied. Finally, cement was applied to the backside of a 38 mm medialized dome patella and the patellar component was positioned and patellar clamp applied. Excess cement was removed using [employer] fast streamer.  After adequate curing of the cement, the tourniquet was deflated after a total tourniquet time of 105 minutes. Hemostasis was achieved using electrocautery. The knee was irrigated with copious amounts of normal saline with antibiotic solution using pulsatile lavage and then suctioned dry. 20 mL of 1.3% Exparel and 60 mL of 0.25% Marcaine in 40 mL of normal saline was injected along the posterior capsule, medial and lateral gutters, and along the arthrotomy site. A 5 mm stabilized rotating platform polyethylene insert was inserted and the knee was placed through a range of motion with excellent mediolateral soft tissue balancing appreciated and excellent patellar tracking noted. 2 medium drains were placed in the wound bed and brought out through separate stab incisions. The medial parapatellar portion of the incision was reapproximated using interrupted sutures of #1 Vicryl. Subcutaneous tissue was approximated in layers using first #0 Vicryl followed #2-0 Vicryl. The skin was approximated with skin staples. A sterile dressing was applied.  The patient tolerated the procedure well and was transported to the recovery room in stable condition.    James P. Holley Bouche., M.D.

## 2018-01-07 NOTE — Transfer of Care (Signed)
Immediate Anesthesia Transfer of Care Note  Patient: [name]  Procedure(s) Performed: COMPUTER ASSISTED TOTAL KNEE ARTHROPLASTY (Left )  Patient Location: PACU  Anesthesia Type:Spinal  Level of Consciousness: sedated  Airway & Oxygen Therapy: Patient Spontanous Breathing and Patient connected to face mask oxygen  Post-op Assessment: Report given to RN and Post -op Vital signs reviewed and stable  Post vital signs: Reviewed and stable  Last Vitals:  Vitals:   01/07/18 1005 01/07/18 1516  BP: (!) 143/72 129/81  Pulse: 86 72  Resp: 17 (!) 25  Temp: 36.4 C (!) 36.1 C  SpO2: 97% 99%    Last Pain:  Vitals:   01/07/18 1005  TempSrc: Oral         Complications: No apparent anesthesia complications

## 2018-01-08 ENCOUNTER — Inpatient Hospital Stay: Payer: Medicare PPO

## 2018-01-08 ENCOUNTER — Encounter: Payer: Self-pay | Admitting: Internal Medicine

## 2018-01-08 ENCOUNTER — Encounter
Admission: RE | Admit: 2018-01-08 | Discharge: 2018-01-08 | Disposition: A | Discharge status: Home / Self Care | Payer: Medicare Other | Source: Ambulatory Visit | Attending: Internal Medicine | Admitting: Internal Medicine

## 2018-01-08 DIAGNOSIS — J441 Chronic obstructive pulmonary disease with (acute) exacerbation: Secondary | ICD-10-CM

## 2018-01-08 DIAGNOSIS — G9340 Encephalopathy, unspecified: Secondary | ICD-10-CM

## 2018-01-08 LAB — BASIC METABOLIC PANEL
Anion gap: 9 (ref 5–15)
BUN: 17 mg/dL (ref 6–20)
CALCIUM: 7.9 mg/dL — AB (ref 8.9–10.3)
CO2: 26 mmol/L (ref 22–32)
CREATININE: 0.93 mg/dL (ref 0.61–1.24)
Chloride: 96 mmol/L — ABNORMAL LOW (ref 101–111)
GFR calc non Af Amer: >60 mL/min (ref >60)
GLUCOSE: 100 mg/dL — AB (ref 65–99)
Potassium: 3.4 mmol/L — ABNORMAL LOW (ref 3.5–5.1)
Sodium: 131 mmol/L — ABNORMAL LOW (ref 135–145)

## 2018-01-08 LAB — URINE DRUG SCREEN, QUALITATIVE (ARMC ONLY)
Amphetamines, Ur Screen: NOT DETECTED
BARBITURATES, UR SCREEN: NOT DETECTED
Benzodiazepine, Ur Scrn: POSITIVE — AB
CANNABINOID 50 NG, UR ~~LOC~~: NOT DETECTED
COCAINE METABOLITE, UR ~~LOC~~: NOT DETECTED
MDMA (ECSTASY) UR SCREEN: NOT DETECTED
Methadone Scn, Ur: POSITIVE — AB
Opiate, Ur Screen: NOT DETECTED
PHENCYCLIDINE (PCP) UR S: NOT DETECTED
Tricyclic, Ur Screen: NOT DETECTED

## 2018-01-08 LAB — BLOOD GAS, ARTERIAL
Acid-Base Excess: 3.5 mmol/L — ABNORMAL HIGH (ref 0.0–2.0)
Bicarbonate: 29.1 mmol/L — ABNORMAL HIGH (ref 20.0–28.0)
FIO2: 1
O2 SAT: 96.7 %
PCO2 ART: 48 mmHg (ref 32.0–48.0)
PH ART: 7.39 (ref 7.350–7.450)
Patient temperature: 37
pO2, Arterial: 88 mmHg (ref 83.0–108.0)

## 2018-01-08 LAB — CBC WITH DIFFERENTIAL/PLATELET
Basophils Absolute: 0.1 10*3/uL (ref 0–0.1)
Basophils Relative: 1 %
Eosinophils Absolute: 0.1 10*3/uL (ref 0–0.7)
Eosinophils Relative: 1 %
HEMATOCRIT: 31.7 % — AB (ref 40.0–52.0)
Hemoglobin: 10.9 g/dL — ABNORMAL LOW (ref 13.0–18.0)
Lymphocytes Relative: 8 %
Lymphs Abs: 0.9 10*3/uL — ABNORMAL LOW (ref 1.0–3.6)
MCH: 35 pg — AB (ref 26.0–34.0)
MCHC: 34.5 g/dL (ref 32.0–36.0)
MCV: 101.4 fL — AB (ref 80.0–100.0)
MONO ABS: 1.1 10*3/uL — AB (ref 0.2–1.0)
MONOS PCT: 9 %
NEUTROS ABS: 9.7 10*3/uL — AB (ref 1.4–6.5)
Neutrophils Relative %: 81 %
Platelets: 250 10*3/uL (ref 150–440)
RBC: 3.13 MIL/uL — ABNORMAL LOW (ref 4.40–5.90)
RDW: 14.2 % (ref 11.5–14.5)
WBC: 12 10*3/uL — ABNORMAL HIGH (ref 3.8–10.6)

## 2018-01-08 LAB — MAGNESIUM: Magnesium: 1.2 mg/dL — ABNORMAL LOW (ref 1.7–2.4)

## 2018-01-08 LAB — ETHANOL: Alcohol, Ethyl (B): <10 mg/dL (ref <10)

## 2018-01-08 LAB — PROTIME-INR
INR: 1.27
PROTHROMBIN TIME: 15.8 s — AB (ref 11.4–15.2)

## 2018-01-08 LAB — BRAIN NATRIURETIC PEPTIDE: B Natriuretic Peptide: 216 pg/mL — ABNORMAL HIGH (ref 0.0–100.0)

## 2018-01-08 LAB — GLUCOSE, CAPILLARY: Glucose-Capillary: 89 mg/dL (ref 65–99)

## 2018-01-08 MED ORDER — NICOTINE 14 MG/24HR TD PT24
14.0000 mg | MEDICATED_PATCH | Freq: Every day | TRANSDERMAL | Status: DC
  Administered 2018-01-08 – 2018-01-09 (×2): 14 mg via TRANSDERMAL
  Filled 2018-01-08 (×3): qty 1

## 2018-01-08 MED ORDER — LORAZEPAM 1 MG PO TABS
1.0000 mg | ORAL_TABLET | Freq: Four times a day (QID) | ORAL | Status: DC | PRN
  Administered 2018-01-10: 1 mg via ORAL
  Filled 2018-01-08 (×2): qty 1

## 2018-01-08 MED ORDER — THIAMINE HCL 100 MG/ML IJ SOLN
100.0000 mg | Freq: Every day | INTRAMUSCULAR | Status: DC
  Administered 2018-01-08 – 2018-01-10 (×3): 100 mg via INTRAVENOUS
  Filled 2018-01-08 (×2): qty 2

## 2018-01-08 MED ORDER — ORAL CARE MOUTH RINSE
15.0000 mL | Freq: Two times a day (BID) | OROMUCOSAL | Status: DC
  Administered 2018-01-08: 15 mL via OROMUCOSAL

## 2018-01-08 MED ORDER — TRAMADOL HCL 50 MG PO TABS: 50.0000 mg | ORAL_TABLET | ORAL | 0 refills | Status: AC | PRN

## 2018-01-08 MED ORDER — LORAZEPAM 1 MG PO TABS
1.0000 mg | ORAL_TABLET | Freq: Once | ORAL | Status: AC
  Administered 2018-01-08: 1 mg via ORAL

## 2018-01-08 MED ORDER — LORAZEPAM 2 MG/ML IJ SOLN
1.0000 mg | Freq: Four times a day (QID) | INTRAMUSCULAR | Status: DC | PRN
  Administered 2018-01-08 – 2018-01-10 (×5): 1 mg via INTRAVENOUS
  Filled 2018-01-08 (×5): qty 1

## 2018-01-08 MED ORDER — LORAZEPAM 1 MG PO TABS: 1.0000 mg | ORAL_TABLET | Freq: Once | ORAL | Status: DC

## 2018-01-08 MED ORDER — MAGNESIUM SULFATE 4 GM/100ML IV SOLN
4.0000 g | Freq: Once | INTRAVENOUS | Status: AC
  Administered 2018-01-08: 4 g via INTRAVENOUS
  Filled 2018-01-08: qty 100

## 2018-01-08 MED ORDER — FUROSEMIDE 10 MG/ML IJ SOLN
20.0000 mg | Freq: Once | INTRAMUSCULAR | Status: AC
  Administered 2018-01-08: 20 mg via INTRAVENOUS
  Filled 2018-01-08: qty 4

## 2018-01-08 MED ORDER — POTASSIUM CHLORIDE CRYS ER 20 MEQ PO TBCR
20.0000 meq | EXTENDED_RELEASE_TABLET | Freq: Two times a day (BID) | ORAL | Status: DC
  Administered 2018-01-08 – 2018-01-10 (×4): 20 meq via ORAL
  Filled 2018-01-08 (×4): qty 1

## 2018-01-08 MED ORDER — TAPENTADOL HCL 50 MG PO TABS: 50.0000 mg | ORAL_TABLET | ORAL | 0 refills | Status: DC | PRN

## 2018-01-08 MED ORDER — DEXMEDETOMIDINE HCL IN NACL 400 MCG/100ML IV SOLN
0.4000 ug/kg/h | INTRAVENOUS | Status: DC
  Administered 2018-01-08 (×2): 0.6 ug/kg/h via INTRAVENOUS
  Administered 2018-01-09: 0.8 ug/kg/h via INTRAVENOUS
  Administered 2018-01-09: 0.6 ug/kg/h via INTRAVENOUS
  Filled 2018-01-08 (×4): qty 100

## 2018-01-08 MED ORDER — IOPAMIDOL (ISOVUE-370) INJECTION 76%
75.0000 mL | Freq: Once | INTRAVENOUS | Status: AC | PRN
  Administered 2018-01-08: 75 mL via INTRAVENOUS

## 2018-01-08 MED ORDER — METHYLPREDNISOLONE SODIUM SUCC 40 MG IJ SOLR
20.0000 mg | Freq: Two times a day (BID) | INTRAMUSCULAR | Status: DC
  Administered 2018-01-08 – 2018-01-10 (×4): 20 mg via INTRAVENOUS
  Filled 2018-01-08 (×4): qty 1

## 2018-01-08 MED ORDER — IPRATROPIUM-ALBUTEROL 0.5-2.5 (3) MG/3ML IN SOLN
3.0000 mL | RESPIRATORY_TRACT | Status: DC
  Administered 2018-01-08 – 2018-01-10 (×13): 3 mL via RESPIRATORY_TRACT
  Filled 2018-01-08 (×13): qty 3

## 2018-01-08 NOTE — Evaluation (Signed)
Physical Therapy Evaluation Patient Details Name: [name] MRN: [id number] DOB: 1941-05-11 Today's Date: 01/08/2018   History of Present Illness  Pt is a76 y.o.malewith a known history of COPD, hyperlipidemia, hypertension, peripheral vascular disease, mesenteric ischemia and status post vascular stenting-on Coumadin at home. Admitted to orthopedic service and is s/p elective left knee arthroplasty.  He was taken off from Coumadin as a preparation of surgery which is resumed and also started on Lovenox subcutaneous.  Pt with worsening shortness of breath and is hypoxic on room air, so medical consult is called in. Patient denies any complaints of chest pain, cough, palpitation. He is on some IV fluid in post op period.  Assessment includes: L TKA, acute respiratory failure with hypoxia, possible acute diastolic CHF, hypercoagulable state-status post mesenteric ischemia, COPD, alcohol abuse with likely withdrawal on CIWA protocol, HTN, and hypokalemia.      Clinical Impression  Pt presents with deficits in strength, transfers, mobility, gait, balance, L knee ROM, and activity tolerance.  Pt on CIWA protocol and went back and forth during session between being able to answer questions and follow simple commands, to being lethargic, or to being agitated with rapid BUE and BLE movements.  Pt able to perform therex per below and bed mobility tasks but was unable to coordinate movements needed to stand from EOB.  Pt's baseline SpO2 on 2LO2/min was 90% decreasing to 88% after bed mobility, nursing notified.  Pt will benefit from PT services in a SNF setting upon discharge to safely address above deficits for decreased caregiver assistance and eventual return to PLOF.      Follow Up Recommendations SNF    Equipment Recommendations  Rolling walker with 5" wheels;Other (comment)(TBD at next venue of care if discharges to SNF)    Recommendations for Other Services       Precautions /  Restrictions Precautions Precautions: Knee Precaution Booklet Issued: Yes (comment) Required Braces or Orthoses: (Pt able to complete Ind LLE SLR without extensor lag) Restrictions Weight Bearing Restrictions: Yes LLE Weight Bearing: Weight bearing as tolerated      Mobility  Bed Mobility Overal bed mobility: Needs Assistance Bed Mobility: Supine to Sit;Sit to Supine     Supine to sit: Min assist Sit to supine: Min assist   General bed mobility comments: Extensive cues for sequencing required  Transfers Overall transfer level: Needs assistance Equipment used: Rolling walker (2 wheeled) Transfers: Sit to/from Stand Sit to Stand: From elevated surface;Max assist         General transfer comment: Pt able to unweight bottom from EOB but unable to clear surface completely, had difficulty following commands/sequencing for transfers  Ambulation/Gait             General Gait Details: Unable  Stairs            Wheelchair Mobility    Modified Rankin (Stroke Patients Only)       Balance Overall balance assessment: Needs assistance Sitting-balance support: Feet unsupported;Feet supported;Bilateral upper extremity supported Sitting balance-Leahy Scale: Fair         Standing balance comment: Unable to stand                             Pertinent Vitals/Pain Pain Assessment: 0-10 Pain Score: 6  Pain Location: L knee Pain Descriptors / Indicators: Aching;Sore;Operative site guarding Pain Intervention(s): Limited activity within patient's tolerance;Monitored during session;Premedicated before session    Home Living Family/patient expects to  be discharged to:: Private residence Living Arrangements: Spouse/significant other Available Help at Discharge: Family;Friend(s);Available PRN/intermittently Type of Home: House Home Access: Ramped entrance     Home Layout: Two level;Able to live on main level with bedroom/bathroom Home Equipment: Cane -  quad      Prior Function Level of Independence: Independent         Comments: Pt Ind with amb community distances, no fall history, Ind with ADLs     Hand Dominance   Dominant Hand: Right    Extremity/Trunk Assessment   Upper Extremity Assessment Upper Extremity Assessment: Overall WFL for tasks assessed    Lower Extremity Assessment Lower Extremity Assessment: Generalized weakness;LLE deficits/detail LLE: Unable to fully assess due to pain       Communication      Cognition Arousal/Alertness: Awake/alert Behavior During Therapy: Restless;Agitated Overall Cognitive Status: No family/caregiver present to determine baseline cognitive functioning                                        General Comments      Exercises Total Joint Exercises Ankle Circles/Pumps: AROM;Both;10 reps(Pt required extra time and cues for technique with therex) Quad Sets: Strengthening;Both;10 reps Gluteal Sets: Strengthening;Both;10 reps Hip ABduction/ADduction: AROM;Both;10 reps Straight Leg Raises: AROM;Both;10 reps Long Arc Quad: AROM;Both;5 reps;10 reps Knee Flexion: AROM;Both;5 reps;10 reps Goniometric ROM: L knee AROM: 10-88 deg   Assessment/Plan    PT Assessment Patient needs continued PT services  PT Problem List Decreased strength;Decreased range of motion;Decreased activity tolerance;Decreased balance;Decreased knowledge of use of DME;Decreased mobility;Decreased safety awareness       PT Treatment Interventions DME instruction;Gait training;Functional mobility training;Neuromuscular re-education;Balance training;Therapeutic exercise;Therapeutic activities;Patient/family education    PT Goals (Current goals can be found in the Care Plan section)  Acute Rehab PT Goals Patient Stated Goal: To walk better PT Goal Formulation: With patient Time For Goal Achievement: 01/21/18 Potential to Achieve Goals: Good    Frequency BID   Barriers to discharge  Inaccessible home environment;Decreased caregiver support      Co-evaluation               AM-PAC PT "6 Clicks" Daily Activity  Outcome Measure Difficulty turning over in bed (including adjusting bedclothes, sheets and blankets)?: Unable Difficulty moving from lying on back to sitting on the side of the bed? : Unable Difficulty sitting down on and standing up from a chair with arms (e.g., wheelchair, bedside commode, etc,.)?: Unable Help needed moving to and from a bed to chair (including a wheelchair)?: Total Help needed walking in hospital room?: Total Help needed climbing 3-5 steps with a railing? : Total 6 Click Score: 6    End of Session Equipment Utilized During Treatment: Gait belt;Oxygen Activity Tolerance: Other (comment);Patient limited by fatigue(Limited ability to follow more complex sequencing this session) Patient left: in bed;with call bell/phone within reach;with SCD's reapplied;with bed alarm set;Other (comment)(Polar care donned to L knee, floor mat in position) Nurse Communication: Mobility status PT Visit Diagnosis: Muscle weakness (generalized) (M62.81);Other abnormalities of gait and mobility (R26.89)    Time: 7829-5621 PT Time Calculation (min) (ACUTE ONLY): 28 min   Charges:   PT Evaluation $PT Eval Low Complexity: 1 Low PT Treatments $Therapeutic Exercise: 8-22 mins   PT G Codes:        D. Scott Maricruz Lucero PT, DPT 01/08/18, 1:20 PM

## 2018-01-08 NOTE — [place] FL2 (Signed)
[place] LEVEL OF CARE SCREENING TOOL     IDENTIFICATION  Patient Name: [name] Birthdate: [birth year] Sex: male Admission Date (Current Location): 01/07/2018  [place] and [place] Number:  Engineering geologist and Address:  [facility], [address] [place], [place] [address]      Provider Number: [id number]  Attending Physician Name and Address:  Dereck Leep, MD  Relative Name and Phone Number:       Current Level of Care: Hospital Recommended Level of Care: [place] Prior Approval Number:    Date Approved/Denied:   PASRR Number: [id number])  Discharge Plan: SNF    Current Diagnoses: Patient Active Problem List   Diagnosis Date Noted  . S/P total knee arthroplasty 01/07/2018  . DJD (degenerative joint disease) of knee 12/29/2017  . Iron deficiency anemia 10/01/2016  . Leukocytosis 08/27/2016  . HTN (hypertension) 08/24/2016  . 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 Mesenteric ischemia ([place]) 08/24/2016  . PAD (peripheral artery disease) ([place]) 08/24/2016  . History of nonmelanoma skin cancer 03/06/2016  . Moderate mitral insufficiency 06/28/2015  . Benign essential hypertension 06/16/2015  . Mixed hyperlipidemia 06/17/2014  . Atrial flutter ([place]) 06/17/2014  . Valvular heart disease 06/17/2014    Orientation RESPIRATION BLADDER Height & Weight     Self, Time, Situation, Place  O2(2L Nasal Cannula) Continent Weight: 156 lb (70.8 kg) Height:  5\' 7"  (170.2 cm)  BEHAVIORAL SYMPTOMS/MOOD NEUROLOGICAL BOWEL NUTRITION STATUS      Continent Diet(Regular)  AMBULATORY STATUS COMMUNICATION OF NEEDS Skin   Extensive Assist Verbally Surgical wounds(Incision Left Knee)                       Personal Care Assistance Level of Assistance  Bathing, Feeding, Dressing Bathing Assistance: Limited assistance Feeding assistance: Independent Dressing Assistance: Limited assistance     Functional Limitations Info  Sight, Hearing, Speech Sight Info: Adequate Hearing Info: Adequate Speech Info: Adequate    SPECIAL CARE FACTORS FREQUENCY  PT (By licensed PT), OT (By licensed OT)     PT Frequency: (5) OT Frequency: (5)            Contractures      Additional Factors Info  Code Status, Allergies Code Status Info: (Full Code) Allergies Info: (ELIQUIS APIXABAN, OXYCODONE-ACETAMINOPHEN, PENICILLINS )           Current Medications (01/08/2018):  This is the current hospital active medication list Current Facility-Administered Medications  Medication Dose Route Frequency Provider Last Rate Last Dose  . 0.9 %  sodium chloride infusion   Intravenous Continuous Hooten, Laurice Record, MD 100 mL/hr at 01/08/18 0346    . acetaminophen (OFIRMEV) IV 1,000 mg  1,000 mg Intravenous Q6H Hooten, Laurice Record, MD   Stopped at 01/08/18 [phone]  . acetaminophen (TYLENOL) tablet 650 mg  650 mg Oral Q4H PRN Hooten, Laurice Record, MD       Or  . acetaminophen (TYLENOL) suppository 650 mg  650 mg Rectal Q4H PRN Hooten, Laurice Record, MD      . alum & mag hydroxide-simeth (MAALOX/MYLANTA) 200-200-20 MG/5ML suspension 30 mL  30 mL Oral Q4H PRN Hooten, Laurice Record, MD      . atenolol (TENORMIN) tablet 25 mg  25 mg Oral Daily Hooten, Laurice Record, MD      . atorvastatin (LIPITOR) tablet 20 mg  20 mg Oral Daily Hooten, Laurice Record, MD      . bisacodyl (  DULCOLAX) suppository 10 mg  10 mg Rectal Daily PRN Hooten, Laurice Record, MD      . celecoxib (CELEBREX) capsule 200 mg  200 mg Oral Q12H Hooten, Laurice Record, MD   200 mg at 01/07/18 2130  . clindamycin (CLEOCIN) IVPB 600 mg  600 mg Intravenous Q6H Hooten, Laurice Record, MD   Stopped at 01/08/18 [phone]  . diltiazem (CARDIZEM) tablet 120 mg  120 mg Oral Daily Hooten, Laurice Record, MD   120 mg at 01/07/18 1743  . diphenhydrAMINE (BENADRYL) 12.5 MG/5ML elixir 12.5-25 mg  12.5-25 mg Oral Q4H PRN Hooten, Laurice Record, MD      . enoxaparin (LOVENOX) injection 30 mg  30 mg Subcutaneous Q12H Hooten, Laurice Record,  MD      . ferrous sulfate tablet 325 mg  325 mg Oral BID WC Hooten, Laurice Record, MD      . gabapentin (NEURONTIN) capsule 300 mg  300 mg Oral QHS Hooten, Laurice Record, MD   300 mg at 01/07/18 2130  . hydrocortisone 2.5 % cream   Topical BID PRN Hooten, Laurice Record, MD      . lisinopril (PRINIVIL,ZESTRIL) tablet 20 mg  20 mg Oral Daily Hooten, Laurice Record, MD   20 mg at 01/07/18 1743  . LORazepam (ATIVAN) tablet 1 mg  1 mg Oral Q6H PRN Leim Fabry, MD       Or  . LORazepam (ATIVAN) injection 1 mg  1 mg Intravenous Q6H PRN Leim Fabry, MD   1 mg at 01/08/18 0326  . magnesium hydroxide (MILK OF MAGNESIA) suspension 30 mL  30 mL Oral Daily PRN Hooten, Laurice Record, MD      . menthol-cetylpyridinium (CEPACOL) lozenge 3 mg  1 lozenge Oral PRN Hooten, Laurice Record, MD   3 mg at 01/08/18 0227   Or  . phenol (CHLORASEPTIC) mouth spray 1 spray  1 spray Mouth/Throat PRN Hooten, Laurice Record, MD      . metoCLOPramide (REGLAN) tablet 10 mg  10 mg Oral TID AC & HS Hooten, Laurice Record, MD   10 mg at 01/07/18 2129  . morphine 2 MG/ML injection 2 mg  2 mg Intravenous Q2H PRN Hooten, Laurice Record, MD      . nicotine (NICODERM CQ - dosed in mg/24 hours) patch 14 mg  14 mg Transdermal Daily Hooten, Laurice Record, MD      . ondansetron (ZOFRAN) tablet 4 mg  4 mg Oral Q6H PRN Hooten, Laurice Record, MD       Or  . ondansetron (ZOFRAN) injection 4 mg  4 mg Intravenous Q6H PRN Hooten, Laurice Record, MD      . pantoprazole (PROTONIX) EC tablet 40 mg  40 mg Oral BID Dereck Leep, MD   40 mg at 01/07/18 2129  . pneumococcal 23 valent vaccine (PNU-IMMUNE) injection 0.5 mL  0.5 mL Intramuscular Tomorrow-1000 Hooten, Laurice Record, MD      . senna-docusate (Senokot-S) tablet 1 tablet  1 tablet Oral BID Dereck Leep, MD   1 tablet at 01/07/18 2130  . sodium phosphate (FLEET) 7-19 GM/118ML enema 1 enema  1 enema Rectal Once PRN Hooten, Laurice Record, MD      . tapentadol (NUCYNTA) tablet 50 mg  50 mg Oral Q4H PRN Dereck Leep, MD   50 mg at 01/08/18 0242  . tapentadol (NUCYNTA) tablet  75 mg  75 mg Oral Q4H PRN Hooten, Laurice Record, MD      . thiamine (B-1) injection 100 mg  100 mg Intravenous Daily Leim Fabry, MD      . traMADol Veatrice Bourbon) tablet 50-100 mg  50-100 mg Oral Q4H PRN Dereck Leep, MD   100 mg at 01/07/18 2145  . warfarin (COUMADIN) tablet 2 mg  2 mg Oral q1800 Hooten, Laurice Record, MD   2 mg at 01/07/18 1844  . Warfarin - Pharmacist Dosing Inpatient   Does not apply q1800 Hooten, Laurice Record, MD         Discharge Medications: Please see discharge summary for a list of discharge medications.  Relevant Imaging Results:  Relevant Lab Results:   Additional Information (SSN: [id number])  Smith Mince, Student-Social Work

## 2018-01-08 NOTE — Progress Notes (Signed)
  ANTICOAGULATION CONSULT NOTE - Initial Consult  Pharmacy Consult for warfarin Indication: VTE ppx/atrial flutter per Dr. Nehemiah Massed  Allergies  Allergen Reactions  . Eliquis [Apixaban] Other (See Comments)    causes nose bleeds  . Oxycodone-Acetaminophen Other (See Comments)    hallucinations  . Penicillins Rash    Childhood allergy Has patient had a PCN reaction causing immediate rash, facial/tongue/throat swelling, SOB or lightheadedness with hypotension: No Has patient had a PCN reaction causing severe rash involving mucus membranes or skin necrosis: No Has patient had a PCN reaction that required hospitalization: No Has patient had a PCN reaction occurring within the last 10 years: No If all of the above answers are "NO", then may proceed with Cephalosporin use.     Patient Measurements: Height: 5\' 7"  (170.2 cm) Weight: 170 lb 6.7 oz (77.3 kg) IBW/kg (Calculated) : 66.1 Heparin Dosing Weight:   Vital Signs: Temp: 98.3 F (36.8 C) (01/22 1339) Temp Source: Axillary (01/22 1339) BP: 106/65 (01/22 1339) Pulse Rate: 72 (01/22 1339)  Labs: Recent Labs    01/07/18 1652 01/08/18 0351 01/08/18 0800  HGB  --   --  10.9*  HCT  --   --  31.7*  PLT  --   --  250  LABPROT 13.8 15.8*  --   INR 1.07 1.27  --   CREATININE  --   --  0.93    Estimated Creatinine Clearance: 63.2 mL/min (by C-G formula based on SCr of 0.93 mg/dL).   Medical History: Past Medical History:  Diagnosis Date  . COPD (chronic obstructive pulmonary disease) ([place])   . HLD (hyperlipidemia)   . Hypertension   . PVD (peripheral vascular disease) ([place])   . Vomiting    and abd pain   Assessment: 25 yom now s/p left TKA. Uses VKA PTA for atrial flutter. Pharmacy consulted to dose VKA for VTE ppx/to continue PTA dosing.  DATE INR DOSE 1/21 1.07 2mg  1/22 1.27 2mg   Goal of Therapy:  INR 2-3 Monitor platelets by anticoagulation protocol: Yes   Plan:  Continue PTA dose 2 mg po once daily. Will  follow INR daily until therapeutic x 2.  Bernese Doffing M Jaedon Siler, Pharm.D., BCPS Clinical Pharmacist 01/08/2018,1:43 PM

## 2018-01-08 NOTE — Progress Notes (Signed)
PT Cancellation Note  Patient Details Name: [name] MRN: [id number] DOB: 21-Mar-1941   Cancelled Treatment:    Reason Eval/Treat Not Completed: Medical issues which prohibited therapy(Patient noted with transfer to CCU due to decline in medical (Respiratory) status.  Per policy, will require new orders to resume PT services.  Please re-consult as medically appropriate.)   Milynn Quirion H. Owens Shark, PT, DPT, NCS 01/08/18, 9:39 PM [phone]

## 2018-01-08 NOTE — Progress Notes (Signed)
OT Cancellation Note  Patient Details Name: [name] MRN: [id number] DOB: 16-Aug-1941   Cancelled Treatment:    Reason Eval/Treat Not Completed: Medical issues which prohibited therapy. Order received, chart reviewed. Spoke with PT who had called RN for update on pt appropriateness for therapy this afternoon. With PT, pt lethargic, agitated, inconsistent with following commands. RN noted possible PE, CT angio ordered. Will hold OT evaluation this date and re-attempt next date pending result of diagnostic testing and POC.  Jeni Salles, MPH, MS, OTR/L ascom [phone] 01/08/18, 1:23 PM

## 2018-01-08 NOTE — Progress Notes (Signed)
PT Cancellation Note  Patient Details Name: [name] MRN: [id number] DOB: August 22, 1941   Cancelled Treatment:    Reason Eval/Treat Not Completed: Medical issues which prohibited therapy; Per nursing PM session of PT to be held this date secondary to multiple medical concerns including rule out of possible PE as well as pt's general decreased cognitive state and ability to follow commands at this time.  Will attempt to see pt at a future date as medically appropriate.    Heloise Beecham Edda Orea PT, DPT 01/08/18, 1:25 PM

## 2018-01-08 NOTE — Progress Notes (Signed)
PULMONARY / CRITICAL CARE MEDICINE   Name: [name] MRN: [id number] DOB: 1941-02-17    ADMISSION DATE:  01/07/2018 CONSULTATION DATE:  01/08/2018   REFERRING MD:  Dr. Anselm Jungling  CHIEF COMPLAINT:  Shortness of breath/hypoxia  HISTORY OF PRESENT ILLNESS:   77 year-old male with PMH as below, POD 1 s/p left knee arthroplasty. Last night, the pt developed worsening shortness of breath and became hypoxic on room air. This afternoon, while on the medical floor, the pt desaturated into the 70s and became unresponsive requiring O2 via NRB. Subsequently, the pt was transferred to the ICU.  Pt has history of mesenteric ischemia s/p stenting and was on [facility] with warfarin which was held for surgery. When the pt desaturated today, a CT-A chest was done, negative for PE.   Pt has history of alcohol abuse, drinking at least a half gallon of liquor daily according to family at bedside. Alcohol level is <10.    On exam, pt is on O2 via NRB and has diffuse wheezing and coarse crackles with diminished respiratory effort. He is responsive to voice but is disoriented and has a tremor.    PAST MEDICAL HISTORY :  He  has a past medical history of COPD (chronic obstructive pulmonary disease) ([place]), HLD (hyperlipidemia), Hypertension, PVD (peripheral vascular disease) ([place]), and Vomiting.  PAST SURGICAL HISTORY: He  has a past surgical history that includes Cardiac catheterization (N/A, 06/15/2015); Cardiac catheterization (06/15/2015); Cardiac catheterization (N/A, 08/25/2016); Cardiac catheterization (N/A, 08/25/2016); Tonsillectomy; Cardiac catheterization (N/A, 11/13/2016); Cardiac catheterization (N/A, 11/15/2016); Cardiac catheterization (N/A, 11/15/2016); and Cardiac catheterization (N/A, 11/15/2016).  Allergies  Allergen Reactions  . Eliquis [Apixaban] Other (See Comments)    causes nose bleeds  . Oxycodone-Acetaminophen Other (See Comments)    hallucinations  . Penicillins Rash    Childhood  allergy Has patient had a PCN reaction causing immediate rash, facial/tongue/throat swelling, SOB or lightheadedness with hypotension: No Has patient had a PCN reaction causing severe rash involving mucus membranes or skin necrosis: No Has patient had a PCN reaction that required hospitalization: No Has patient had a PCN reaction occurring within the last 10 years: No If all of the above answers are "NO", then may proceed with Cephalosporin use.     No current facility-administered medications on file prior to encounter.    Current Outpatient Medications on File Prior to Encounter  Medication Sig  . atenolol (TENORMIN) 25 MG tablet Take 25 mg by mouth daily.  Marland Kitchen atorvastatin (LIPITOR) 40 MG tablet Take 20 mg by mouth daily. Takes 0.5 tablet  . diltiazem (CARDIZEM) 120 MG tablet Take 120 mg by mouth daily.   Marland Kitchen lisinopril (PRINIVIL,ZESTRIL) 20 MG tablet Take 20 mg by mouth daily. In am.  . warfarin (COUMADIN) 2 MG tablet Take by mouth.    FAMILY HISTORY:  His indicated that the status of his mother is unknown. He indicated that the status of his father is unknown.   SOCIAL HISTORY: He  reports that he quit smoking about 5 years ago. His smoking use included e-cigarettes. he has never used smokeless tobacco. He reports that he drinks about 1.2 - 2.4 oz of alcohol per week. He reports that he does not use drugs.  REVIEW OF SYSTEMS:   Not done 2/2 pt confusion  SUBJECTIVE:  Ill appearing male with confusion and tremor. Mildly agitated.  VITAL SIGNS: BP 117/61   Pulse 78   Temp 98.3 F (36.8 C) (Axillary)   Resp (!) 27   Ht 5'  7" (1.702 m)   Wt 77.3 kg (170 lb 6.7 oz)   SpO2 93%   BMI 26.69 kg/m     INTAKE / OUTPUT: I/O last 3 completed shifts: In: 4633.3 [P.O.:1620; I.V.:2463.3; IV Piggyback:550] Out: 1310 [Urine:1000; Drains:260; Blood:50]  PHYSICAL EXAMINATION: General:  Ill-appearing  Neuro:  Responsive to voice, incoherent speech, +tremor. GCS 12 HEENT:  NCAT,  PERRL Cardiovascular:  S1S2, RRR, no m/g/r 2+ peripheral pulses, no LE edema Lungs:  BS decreased with +wheezing and coarse crackles. No accessory muscle use Abdomen:  Soft, non-distended Musculoskeletal:  No gross deformity Skin:  Warm dry acyanotic. No lesions.  LABS:  BMET Recent Labs  Lab 01/08/18 0800  NA 131*  K 3.4*  CL 96*  CO2 26  BUN 17  CREATININE 0.93  GLUCOSE 100*    Electrolytes Recent Labs  Lab 01/08/18 0352 01/08/18 0800  CALCIUM  --  7.9*  MG 1.2*  --     CBC Recent Labs  Lab 01/08/18 0800  WBC 12.0*  HGB 10.9*  HCT 31.7*  PLT 250    Coag's Recent Labs  Lab 01/07/18 1652 01/08/18 0351  INR 1.07 1.27    Sepsis Markers No results for input(s): LATICACIDVEN, PROCALCITON, O2SATVEN in the last 168 hours.  ABG Recent Labs  Lab 01/08/18 1323  PHART 7.39  PCO2ART 48  PO2ART 88    Liver Enzymes No results for input(s): AST, ALT, ALKPHOS, BILITOT, ALBUMIN in the last 168 hours.  Cardiac Enzymes No results for input(s): TROPONINI, PROBNP in the last 168 hours.  Glucose Recent Labs  Lab 01/08/18 1320  GLUCAP 89    Imaging   STUDIES:  1/22 CT-A chest no evidence of PE   SIGNIFICANT EVENTS: 1/21 Left knee arthroplasty performed, pt admitted to medical floor 1/22 Pt developed respiratory distress and tremors with agitation. Rapid response called on the floor for desaturation to the 70s requiring NRB. Pt admitted to ICU for further management.   DISCUSSION: Pt with significant history of heavy alcohol use who has been inpatient for >24 hours. Alcohol level <10. Agitation and tremor c/w DTs. Pt also with hx of COPD and wheezing on exam, also with rhonchi concerning for aspiration.  ASSESSMENT / PLAN: 77 year-old male s/p left knee arthroplasty with acute alcohol withdrawal and acute hypoxic respiratory failure requiring NRB. Treatment of withdrawal complicated by respiratory depression with COPD exacerbation.    PULMONARY A: Acute hypoxic respiratory failure AECOPD  P:   Continue supplemental oxygen spO2 goal 88-92% Bronchodilators IV Steroids Inhaled steroids  CARDIOVASCULAR A:  Cardiomegaly in the setting of alcohol abuse and probable pulmonary hypertension Elevated BNP P:  Echocardiogram pending  RENAL A:  Hypokalemia and hypomagnesemia, likely 2/2 chronic alcohol use   P:   Replete electrolytes as needed Recheck in the morning  GASTROINTESTINAL A:   No issues P:   SUP Keep NPO for now High risk for aspiration  HEMATOLOGIC A:   No issues P:  Warfarin restarted [place] with lovenox per pharmacy  INFECTIOUS A:   No problems P:    ENDOCRINE A:   No problems   P:   CBG monitoring QD  NEUROLOGIC A:   Acute encephalopathy 2/2 alcohol withdrawal P:   Continue CIWA protocol   FAMILY  - Updates: Discussed initial impression and care plan with family at bedside. Questions elicited and answered.      Critical Care Time devoted to patient care services described in this note is 55 minutes.   Overall,  patient is critically ill, prognosis is guarded.  Patient with Multiorgan failure and at high risk for cardiac arrest and death.    Corrin Parker, M.D.  [facility]  Medical Director [facility] Director [facility] Cardio-Pulmonary Department

## 2018-01-08 NOTE — Plan of Care (Addendum)
Patient anxious, a little agitated, tremors and audible Rhonci with rapid breathing (SPO2 88% on 2L-acute). Increased to 3L [place]. Last dose ativan at 0330 from prior similar episode.Possible need for ativan dose, but concerned about resp. Status. Dr. Curly Rim to assess.  PT also worked with patient and stated too weak to stand, plus noted AMS. Unable to feed self due to tremors- NT asked to assist with feedings.  Dr. Annie Main page. Verbal orders to place hospitalist. Resp contacted to assess and Rapid team asked to assess.Repeat STAT CXR, continuous pulse ox ordered via rapid. Hospitalist paged to inform of consult.

## 2018-01-08 NOTE — Progress Notes (Signed)
OT Cancellation Note  Patient Details Name: [name] MRN: [id number] DOB: [birth year]   Cancelled Treatment:    Reason Eval/Treat Not Completed: Patient at procedure or test/ unavailable. Pt now noted to have transferred from 1A to ICU. Due to change in pt status, per therapy protocol, will complete current OT orders. Will need new OT orders. Please re-consult for OT to evaluate and treat pt when medically appropriate.   Jeni Salles, MPH, MS, OTR/L ascom [phone] 01/08/18, 2:57 PM

## 2018-01-08 NOTE — Progress Notes (Signed)
Urine alc screen sent to lab.

## 2018-01-08 NOTE — Clinical Social Work Note (Addendum)
Clinical Social Work Assessment  Patient Details  Name: [name] MRN: [id number] Date of Birth: [birth year]  Date of referral:  01/08/18               Reason for consult:  Facility Placement, Discharge Planning                Permission sought to share information with:  Chartered certified accountant granted to share information::  Yes, Verbal Permission Granted  Name::      [place]::   [place]  Relationship::     Contact Information:     Housing/Transportation Living arrangements for the past 2 months:  [place] of Information:  Power of Attorney Patient Interpreter Needed:  None Criminal Activity/Legal Involvement Pertinent to Current Situation/Hospitalization:  No - Comment as needed Significant Relationships:  Other Family Members Lives with:  Self Do you feel safe going back to the place where you live?  No Need for family participation in patient care:  Yes (Comment)  Care giving concerns: Patient lives alone in [place].   Facilities manager / plan: [place] representative ([place]) received SNF consult. PT is recommending SNF. Social work Theatre manager met with patient, Orland Penman [phone]), and sister Vaughan Basta [phone]) at bedside. Patient was not alert or oriented and was unable to participate in assessment. Social work Theatre manager introduced self and explained the role of the [place]. Patient's granddaughter shared that patient lives in [place] alone. Social work Theatre manager explained that PT is recommending SNF and that Mcarthur Rossetti will have to approve it. Patient's granddaughter verbalized her understanding and was interested in SNF placement. FL2 completed and faxed out.  Social work Theatre manager presented bed offers and patient's granddaughter chose [facility] and is aware that patient may have to share a room. CSW has started [facility] authorization via [employer]. Sharyn Lull, admission coordinator at  [facility], is aware of accepted bed offer. CSW and social work Theatre manager will continue to follow up and assist.   Employment status:  Retired Nurse, adult PT Recommendations:  [place] / Referral to community resources:  [place]  Patient/Family's Response to care: Patient's granddaughter is agreeable to SNF placement and accepted [facility] bed offer.   Patient/Family's Understanding of and Emotional Response to Diagnosis, Current Treatment, and Prognosis: Patient's family members were pleasant and thanked social work Theatre manager for her assistance.  Emotional Assessment Appearance:  Appears stated age Attitude/Demeanor/Rapport:  Unable to Assess Affect (typically observed):  Unable to Assess Orientation:  Oriented to Self Alcohol / Substance use:  Not Applicable Psych involvement (Current and /or in the community):  No (Comment)  Discharge Needs  Concerns to be addressed:  Care Coordination, Discharge Planning Concerns Readmission within the last 30 days:  No Current discharge risk:  Dependent with Mobility Barriers to Discharge:  Continued Medical Work up   Smith Mince, Student-Social Work 01/08/2018, 2:14 PM

## 2018-01-08 NOTE — Plan of Care (Addendum)
Patient needs STAT CT to r/o PE,  Dr. Hulen Skains to inform patient tremors need to be controlled so test can be completed - but Ativan IV is contraindicated d/t patient resp distress. 1mg  Ativan PO ordered. CT will be contacted to inform. RN will assess if medication can be effective for test and inform Dr. As needed.   Patient calmer. CT called to attempt scan of keep patient on 5L continuous

## 2018-01-08 NOTE — Plan of Care (Signed)
  Education: Knowledge of General Education information will improve 01/08/2018 0250 - Progressing by Ane Conerly, Lucille Passy, RN   Health Behavior/Discharge Planning: Ability to manage health-related needs will improve 01/08/2018 0250 - Progressing by Salih Williamson, Lucille Passy, RN   Clinical Measurements: Ability to maintain clinical measurements within normal limits will improve 01/08/2018 0250 - Progressing by Cyana Shook, Lucille Passy, RN Will remain free from infection 01/08/2018 0250 - Progressing by Evertt Chouinard, Lucille Passy, RN Diagnostic test results will improve 01/08/2018 0250 - Progressing by Timm Bonenberger, Lucille Passy, RN Respiratory complications will improve 01/08/2018 0250 - Progressing by Bryna Colander, RN Cardiovascular complication will be avoided 01/08/2018 0250 - Progressing by Chaniah Cisse, Lucille Passy, RN   Activity: Risk for activity intolerance will decrease 01/08/2018 0250 - Progressing by Bryna Colander, RN   Nutrition: Adequate nutrition will be maintained 01/08/2018 0250 - Progressing by Bryna Colander, RN   Elimination: Will not experience complications related to bowel motility 01/08/2018 0250 - Progressing by Mikayah Joy, Lucille Passy, RN Will not experience complications related to urinary retention 01/08/2018 0250 - Progressing by Eretria Manternach, Lucille Passy, RN   Pain Managment: General experience of comfort will improve 01/08/2018 0250 - Progressing by Kalenna Millett, Lucille Passy, RN   Safety: Ability to remain free from injury will improve 01/08/2018 0250 - Progressing by Peregrine Nolt, Lucille Passy, RN

## 2018-01-08 NOTE — Plan of Care (Addendum)
Telemetry contacted me via phone, indicating that patient was sustaining in mid 70s SPO2. CT contacted and confirmed that patient was on 5L [place]. Asked to bump to 6L and I ran down to CT with non-rebreather, while calling rapid response.  Patient extremely lethargic and unresponsive without multiple verbal and sternal rub attempts.  Even with non-rebreather @ 15L, SPO2 was still reading mid 80s.  Patient transferred to ICU rm 14. Report given to ICU nurse. I walked to 152 with [place] to inform family of situation and escorted them to Critical Care waiting room. Dr Bess Harvest and Molli Hazard notified.

## 2018-01-08 NOTE — Consult Note (Signed)
[facility] [place] NAME: [name]    MR#:  [id number]  DATE OF BIRTH:  Oct 18, 1941  DATE OF ADMISSION:  01/07/2018  PRIMARY CARE PHYSICIAN: Patient, No Pcp Per   REQUESTING/REFERRING PHYSICIAN: [place]  CHIEF COMPLAINT:  No chief complaint on file.   HISTORY OF PRESENT ILLNESS: [name]  is a 77 y.o. male with a known history of COPD, hyperlipidemia, hypertension, peripheral vascular disease, mesenteric ischemia and status post vascular stenting- on Coumadin at home.  Admitted to orthopedic service for left knee arthroplasty- status post surgery day 1. He was taken off from Coumadin as a preparation of surgery 5 days ago. Which is resumed yesterday and also started on Lovenox subcutaneous for now. Since last night he has worsening shortness of breath and he is hypoxic on room air, so medical consult is called in. Patient denies any complaints of chest pain, cough, palpitation. He is on some IV fluid in post op period.  PAST MEDICAL HISTORY:   Past Medical History:  Diagnosis Date  . COPD (chronic obstructive pulmonary disease) ([place])   . HLD (hyperlipidemia)   . Hypertension   . PVD (peripheral vascular disease) ([place])   . Vomiting    and abd pain    PAST SURGICAL HISTORY:  Past Surgical History:  Procedure Laterality Date  . PERIPHERAL VASCULAR CATHETERIZATION N/A 06/15/2015   Procedure: Abdominal Aortogram w/Lower Extremity;  Surgeon: Katha Cabal, MD;  Location: [facility];  Service: Cardiovascular;  Laterality: N/A;  . PERIPHERAL VASCULAR CATHETERIZATION  06/15/2015   Procedure: Lower Extremity Intervention;  Surgeon: Katha Cabal, MD;  Location: [facility];  Service: Cardiovascular;;  . PERIPHERAL VASCULAR CATHETERIZATION N/A 08/25/2016   Procedure: Abdominal Aortogram;  Surgeon: Algernon Huxley, MD;  Location: [facility];  Service: Cardiovascular;  Laterality: N/A;  . PERIPHERAL VASCULAR  CATHETERIZATION N/A 08/25/2016   Procedure: Visceral Artery Intervention;  Surgeon: Algernon Huxley, MD;  Location: [facility];  Service: Cardiovascular;  Laterality: N/A;  . PERIPHERAL VASCULAR CATHETERIZATION N/A 11/13/2016   Procedure: Visceral Angiography;  Surgeon: Algernon Huxley, MD;  Location: [facility];  Service: Cardiovascular;  Laterality: N/A;  . PERIPHERAL VASCULAR CATHETERIZATION N/A 11/15/2016   Procedure: Aortic Intervention;  Surgeon: Katha Cabal, MD;  Location: [facility];  Service: Cardiovascular;  Laterality: N/A;  . PERIPHERAL VASCULAR CATHETERIZATION N/A 11/15/2016   Procedure: Visceral Angiography;  Surgeon: Katha Cabal, MD;  Location: [facility];  Service: Cardiovascular;  Laterality: N/A;  . PERIPHERAL VASCULAR CATHETERIZATION N/A 11/15/2016   Procedure: Visceral Artery Intervention;  Surgeon: Katha Cabal, MD;  Location: [facility];  Service: Cardiovascular;  Laterality: N/A;  . TONSILLECTOMY      SOCIAL HISTORY:  Social History   Tobacco Use  . Smoking status: Former Smoker    Types: E-cigarettes    Last attempt to quit: 2014    Years since quitting: 5.0  . Smokeless tobacco: Never Used  Substance Use Topics  . Alcohol use: Yes    Alcohol/week: 1.2 - 2.4 oz    Types: 2 - 4 Cans of beer per week    FAMILY HISTORY:  Family History  Problem Relation Age of Onset  . Heart attack Mother   . Heart attack Father   . Basal cell carcinoma Father     DRUG ALLERGIES:  Allergies  Allergen Reactions  . Eliquis [Apixaban]  Other (See Comments)    causes nose bleeds  . Oxycodone-Acetaminophen Other (See Comments)    hallucinations  . Penicillins Rash    Childhood allergy Has patient had a PCN reaction causing immediate rash, facial/tongue/throat swelling, SOB or lightheadedness with hypotension: No Has patient had a PCN reaction causing severe rash involving mucus membranes or skin necrosis: No Has  patient had a PCN reaction that required hospitalization: No Has patient had a PCN reaction occurring within the last 10 years: No If all of the above answers are "NO", then may proceed with Cephalosporin use.     REVIEW OF SYSTEMS:   CONSTITUTIONAL: No fever, fatigue or weakness.  EYES: No blurred or double vision.  EARS, NOSE, AND THROAT: No tinnitus or ear pain.  RESPIRATORY: No cough, have shortness of breath,no wheezing or hemoptysis.  CARDIOVASCULAR: No chest pain, orthopnea, edema.  GASTROINTESTINAL: No nausea, vomiting, diarrhea or abdominal pain.  GENITOURINARY: No dysuria, hematuria.  ENDOCRINE: No polyuria, nocturia,  HEMATOLOGY: No anemia, easy bruising or bleeding SKIN: No rash or lesion. MUSCULOSKELETAL: No joint pain or arthritis.   NEUROLOGIC: No tingling, numbness, weakness.  PSYCHIATRY: No anxiety or depression.   MEDICATIONS AT HOME:  Prior to Admission medications   Medication Sig Start Date End Date Taking? Authorizing Provider  atenolol (TENORMIN) 25 MG tablet Take 25 mg by mouth daily.   Yes [provider]  atorvastatin (LIPITOR) 40 MG tablet Take 20 mg by mouth daily. Takes 0.5 tablet   Yes [provider]  diltiazem (CARDIZEM) 120 MG tablet Take 120 mg by mouth daily.    Yes [provider]  hydrocortisone 2.5 % cream Apply topically. 04/02/17  Yes [provider]  lisinopril (PRINIVIL,ZESTRIL) 20 MG tablet Take 20 mg by mouth daily. In am.   Yes [provider]  warfarin (COUMADIN) 2 MG tablet Take by mouth. 01/30/17 01/30/18 Yes [provider]  tapentadol (NUCYNTA) 50 MG tablet Take 1 tablet (50 mg total) by mouth every 4 (four) hours as needed for moderate pain. 01/08/18   Watt Climes, PA  traMADol (ULTRAM) 50 MG tablet Take 1-2 tablets (50-100 mg total) by mouth every 4 (four) hours as needed for moderate pain. 01/08/18   Watt Climes, PA      PHYSICAL EXAMINATION:   VITAL SIGNS: Blood pressure  106/65, pulse 85, temperature 98 F (36.7 C), temperature source Axillary, resp. rate (!) 24, height 5\' 7"  (1.702 m), weight 70.8 kg (156 lb), SpO2 95 %.  GENERAL:  77 y.o.-year-old patient lying in the bed with no acute distress.  EYES: Pupils equal, round, reactive to light and accommodation. No scleral icterus. Extraocular muscles intact.  HEENT: Head atraumatic, normocephalic. Oropharynx and nasopharynx clear.  NECK:  Supple, no jugular venous distention. No thyroid enlargement, no tenderness.  LUNGS: Normal breath sounds bilaterally, no wheezing, some crepitation. No use of accessory muscles of respiration.  CARDIOVASCULAR: S1, S2 normal. No murmurs, rubs, or gallops.  ABDOMEN: Soft, nontender, nondistended. Bowel sounds present. No organomegaly or mass.  EXTREMITIES: No pedal edema, cyanosis, or clubbing. Left knee status post surgical dressing and wound VAC present. NEUROLOGIC: Cranial nerves II through XII are intact. Muscle strength 5/5 in all extremities. Sensation intact. Gait not checked.  PSYCHIATRIC: The patient is slightly drowsy, but easily arousable and oriented on arousal.  SKIN: No obvious rash, lesion, or ulcer.   LABORATORY PANEL:   CBC Recent Labs  Lab 01/08/18 0800  WBC 12.0*  HGB 10.9*  HCT  31.7*  PLT 250  MCV 101.4*  MCH 35.0*  MCHC 34.5  RDW 14.2  LYMPHSABS 0.9*  MONOABS 1.1*  EOSABS 0.1  BASOSABS 0.1   ------------------------------------------------------------------------------------------------------------------  Chemistries  Recent Labs  Lab 01/08/18 0800  NA 131*  K 3.4*  CL 96*  CO2 26  GLUCOSE 100*  BUN 17  CREATININE 0.93  CALCIUM 7.9*   ------------------------------------------------------------------------------------------------------------------ estimated creatinine clearance is 63.2 mL/min (by C-G formula based on SCr of 0.93  mg/dL). ------------------------------------------------------------------------------------------------------------------ No results for input(s): TSH, T4TOTAL, T3FREE, THYROIDAB in the last 72 hours.  Invalid input(s): FREET3   Coagulation profile Recent Labs  Lab 01/07/18 1652 01/08/18 0351  INR 1.07 1.27   ------------------------------------------------------------------------------------------------------------------- No results for input(s): DDIMER in the last 72 hours. -------------------------------------------------------------------------------------------------------------------  Cardiac Enzymes No results for input(s): CKMB, TROPONINI, MYOGLOBIN in the last 168 hours.  Invalid input(s): CK ------------------------------------------------------------------------------------------------------------------ Invalid input(s): POCBNP  ---------------------------------------------------------------------------------------------------------------  Urinalysis    Component Value Date/Time   COLORURINE YELLOW (A) 12/26/2017 1515   APPEARANCEUR CLEAR (A) 12/26/2017 1515   LABSPEC 1.012 12/26/2017 1515   PHURINE 6.0 12/26/2017 1515   GLUCOSEU NEGATIVE 12/26/2017 1515   HGBUR MODERATE (A) 12/26/2017 1515   BILIRUBINUR NEGATIVE 12/26/2017 1515   KETONESUR NEGATIVE 12/26/2017 1515   PROTEINUR NEGATIVE 12/26/2017 1515   NITRITE NEGATIVE 12/26/2017 1515   LEUKOCYTESUR NEGATIVE 12/26/2017 1515     RADIOLOGY: Dg Chest Port 1 View  Result Date: 01/08/2018 CLINICAL DATA:  Acute respiratory failure EXAM: PORTABLE CHEST 1 VIEW COMPARISON:  01/08/2018 FINDINGS: Cardiac shadow is stable. Aortic calcifications are again seen. Bibasilar atelectatic changes are noted increased from the prior exam. Old rib fractures with healing on the left are seen. IMPRESSION: Bibasilar atelectatic changes increased from the prior exam. Electronically Signed   By: Inez Catalina M.D.   On: 01/08/2018  10:27   Dg Chest Port 1 View  Result Date: 01/08/2018 CLINICAL DATA:  Abnormal respiration EXAM: PORTABLE CHEST 1 VIEW COMPARISON:  None. FINDINGS: The heart size and mediastinal contours are within normal limits. Both lungs are clear. Comminuted fracture of the right humeral head. IMPRESSION: 1. No active cardiopulmonary disease. 2. Comminuted fracture of the right humeral head. Electronically Signed   By: Ulyses Jarred M.D.   On: 01/08/2018 03:37   Dg Knee Left Port  Result Date: 01/07/2018 CLINICAL DATA:  Status post left knee replacement today. EXAM: PORTABLE LEFT KNEE - 1-2 VIEW COMPARISON:  None. FINDINGS: Left total knee arthroplasty is in place. The device is located. No fracture. Surgical drain and staples are noted. IMPRESSION: Status post left total knee replacement.  No acute finding. Electronically Signed   By: Inge Rise M.D.   On: 01/07/2018 15:37    EKG: Orders placed or performed during the hospital encounter of 12/26/17  . EKG 12-Lead  . EKG 12-Lead    IMPRESSION AND PLAN:  * Acute respiratory failure with hypoxia      This appears to be a combination of his COPD, I also do see some fluid on his chest x-ray.   I will give him DuoNeb nebulizer treatment.   Also stopped his IV fluids.   As he had hypercoagulable state and was on Coumadin which is held for last 5 days, he is at higher risk of developing pulmonary embolism. So I will also get a CT angiogram of chest to rule out pulmonary emboli.   Meanwhile agree with continuing oxygen via nasal cannula.   . Would like to minimize the use of  lorazepam because of his respiratory status.  * Possible acute diastolic congestive heart failure   Will hold IV fluids for now, CT scan will give some idea about his pulmonary edema.   Echocardiogram.  * Hypercoagulable state- status post mesenteric ischemia   Was on Coumadin,  with resuming now postoperative.  * COPD   No signs of exacerbation, continue DuoNeb  nebulizers.  * Alcohol abuse   Likely alcohol withdrawal   On CIWA protocol, would like to keep the use at minimum because of patient's slight drowsiness.  * Hypertension   As blood pressure is running soft, I would continue atenolol but hold lisinopril for now.  * Hypokalemia   Likely secondary to alcoholism, check magnesium. Replace oral potassium.  I will follow-up on the results.  All the records are reviewed and case discussed with ED provider. Management plans discussed with the patient, family and they are in agreement.  CODE STATUS: Full code    Code Status Orders  (From admission, onward)        Start     Ordered   01/07/18 1612  Full code  Continuous     01/07/18 1611    Code Status History    Date Active Date Inactive Code Status Order ID Comments User Context   11/15/2016 01:42 11/16/2016 17:19 Full Code [id number]  Katha Cabal, MD Inpatient   11/13/2016 16:34 11/13/2016 21:17 Full Code [id number]  Algernon Huxley, MD Inpatient   08/24/2016 20:35 08/27/2016 17:55 Full Code [id number]  Katha Cabal, MD ED   06/15/2015 10:36 06/15/2015 15:08 Full Code [id number]  Schnier, Dolores Lory, MD Inpatient    Advance Directive Documentation     Most Recent Value  Type of Advance Directive  Healthcare Power of Attorney, Living will  Pre-existing out of facility DNR order (yellow form or pink MOST form)  No data  "MOST" Form in Place?  No data     Discussion with his sister in the room.  Plan discussed with his nurse on the floor.  TOTAL TIME TAKING CARE OF THIS PATIENT: 50 minutes.    Vaughan Basta M.D on 01/08/2018   Between 7am to 6pm - Pager - [phone]  After 6pm go to [email or web address] - password [email or web address] [facility]  Office  [phone]  CC: Primary care physician; Patient, No Pcp Per   Note: This dictation was prepared with Dragon dictation along with smaller phrase technology. Any transcriptional errors that result from  this process are unintentional.

## 2018-01-08 NOTE — Plan of Care (Addendum)
Patient's granddaughter Dyann Ruddle) came into room displaying large amounts of anxiety towards the patient's current condition.  She stated that he drinks heavily and Dr. Was informed of this prior to surgery.  She felt he should've been on withdrawal precautions even before surgery.  In attempting to understand the situation things became "heated" and accusatory. I left the room while the charge nurse attempted to calm things down a little.  About 15 minutes later I re-entered the room and apologized for any misunderstandings. Though still tense, things appeared manageable.  I also printed out prior notes from the Dr.'s and RNs, per her request.

## 2018-01-08 NOTE — Progress Notes (Signed)
Pt is resting quietly after 1 mg of ativan per ciwa protocol administered.

## 2018-01-08 NOTE — Anesthesia Postprocedure Evaluation (Signed)
Anesthesia Post Note  Patient: [name]  Procedure(s) Performed: COMPUTER ASSISTED TOTAL KNEE ARTHROPLASTY (Left )  Patient location during evaluation: Nursing Unit Anesthesia Type: Spinal Level of consciousness: awake and alert and oriented Pain management: satisfactory to patient Vital Signs Assessment: post-procedure vital signs reviewed and stable Respiratory status: respiratory function stable Cardiovascular status: stable Postop Assessment: no backache, no headache, no apparent nausea or vomiting, patient able to bend at knees, adequate PO intake and spinal receding Anesthetic complications: no     Last Vitals:  Vitals:   01/08/18 0315 01/08/18 0550  BP:  118/61  Pulse: 98 85  Resp:  (!) 24  Temp:  36.7 C  SpO2: 96% 95%    Last Pain:  Vitals:   01/08/18 0603  TempSrc:   PainSc: Ambrose Mantle

## 2018-01-08 NOTE — Discharge Summary (Signed)
Physician Discharge Summary  Patient ID: [name] MRN: [id number] DOB/AGE: 1940-12-20 77 y.o.  Admit date: 01/07/2018 Discharge date: 01/09/2018  Admission Diagnoses:  PRIMARY OSTEOARTHRITIS OF LEFT KNEE   Discharge Diagnoses: Patient Active Problem List   Diagnosis Date Noted  . S/P total knee arthroplasty 01/07/2018  . DJD (degenerative joint disease) of knee 12/29/2017  . Iron deficiency anemia 10/01/2016  . Leukocytosis 08/27/2016  . HTN (hypertension) 08/24/2016  . 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 Mesenteric ischemia ([place]) 08/24/2016  . PAD (peripheral artery disease) ([place]) 08/24/2016  . History of nonmelanoma skin cancer 03/06/2016  . Moderate mitral insufficiency 06/28/2015  . Benign essential hypertension 06/16/2015  . Mixed hyperlipidemia 06/17/2014  . Atrial flutter ([place]) 06/17/2014  . Valvular heart disease 06/17/2014    Past Medical History:  Diagnosis Date  . COPD (chronic obstructive pulmonary disease) ([place])   . HLD (hyperlipidemia)   . Hypertension   . PVD (peripheral vascular disease) ([place]   . Vomiting    and abd pain     Transfusion: No transfusions during this admission   Consultants (if any):   Discharged Condition: Improved  Hospital Course: [name] is an 77 y.o. male who was admitted 01/07/2018 with a diagnosis of degenerative arthrosis left knee and went to the operating room on 01/07/2018 and underwent the above named procedures. Day 1 following surgery patient was noted to become hypoxic subsequently medical consult was obtained. Chest x-ray  did not reveal any evidence of any active condition. EKG was unremarkable. He however was taken to ICU where he was placed on a rebreather mask and placed on CIWA protocol. No other intervention was performed. He was brought back to the orthopedic floor and on day 3 was able to ambulate around the nurse's desk, do steps and met all criteria for going home. No other  complications were encountered during this visit. Daughter was adamant that she taking home on the day of discharge.   Surgeries:Procedure(s): COMPUTER ASSISTED TOTAL KNEE ARTHROPLASTY on 01/07/2018  PRE-OPERATIVE DIAGNOSIS: Degenerative arthrosis of the left knee, primary  POST-OPERATIVE DIAGNOSIS:  Same  PROCEDURE:  Left total knee arthroplasty using computer-assisted navigation  SURGEON:  Marciano Sequin. M.D.  ASSISTANT:  Vance Peper, PA (present and scrubbed throughout the case, critical for assistance with exposure, retraction, instrumentation, and closure)  ANESTHESIA: spinal  ESTIMATED BLOOD LOSS: 50 mL  FLUIDS REPLACED: 1300 mL of crystalloid  TOURNIQUET TIME: 105 minutes  DRAINS: 2 medium Hemovac drains  SOFT TISSUE RELEASES: Anterior cruciate ligament, posterior cruciate ligament, deep and superficial medial collateral ligament, patellofemoral ligament  IMPLANTS UTILIZED: DePuy Attune size 6 posterior stabilized femoral component (cemented), size 7 rotating platform tibial component (cemented), 38 mm medialized dome patella (cemented), and a 5 mm stabilized rotating platform polyethylene insert.  INDICATIONS FOR SURGERY: [name] is a 77 y.o. year old male with a long history of progressive knee pain. X-rays demonstrated severe degenerative changes in tricompartmental fashion. The patient had not seen any significant improvement despite conservative nonsurgical intervention. After discussion of the risks and benefits of surgical intervention, the patient expressed understanding of the risks benefits and agree with plans for total knee arthroplasty.   The risks, benefits, and alternatives were discussed at length including but not limited to the risks of infection, bleeding, nerve injury, stiffness, blood clots, the need for revision surgery, cardiopulmonary complications, among others, and they were willing to proceed.   Patient tolerated the surgery  well.  No complications .Patient was taken to PACU where she was stabilized and then transferred to the orthopedic floor.  Patient started on Lovenox 30 mg q 12 hrs so as to bridge patient until his Coumadin levels were therapeutic. Foot pumps applied bilaterally at 80 mm hgb. Heels elevated off bed with rolled towels. No evidence of DVT. Calves non tender. Negative Homan. Physical therapy started on day #1 for gait training and transfer with OT starting on  day #1 for ADL and assisted devices. Patient has done well with therapy. Ambulated greater than 200 feet upon being discharged. Was able to ascend and descend 4 steps safely and independently.  Patient's IV And Foley were discontinued on day #1 with Hemovac being discontinued on day #2. Dressing was changed on day 2 prior to patient being discharged   He was given perioperative antibiotics:  Anti-infectives (From admission, onward)   Start     Dose/Rate Route Frequency Ordered Stop   01/07/18 1615  clindamycin (CLEOCIN) IVPB 600 mg     600 mg 100 mL/hr over 30 Minutes Intravenous Every 6 hours 01/07/18 1611 01/08/18 1614   01/07/18 0957  clindamycin (CLEOCIN) 900 MG/50ML IVPB    Comments:  Sylvester Harder   : cabinet override      01/07/18 0957 01/07/18 1217   01/07/18 0600  clindamycin (CLEOCIN) IVPB 900 mg  Status:  Discontinued     900 mg 100 mL/hr over 30 Minutes Intravenous On call to O.R. 01/06/18 2138 01/07/18 8938    .  He was fitted with AV 1 compression foot pump devices, instructed on heel pumps, early ambulation, and fitted with TED stockings bilaterally for DVT prophylaxis.  He benefited maximally from the hospital stay and there were no complications.    Recent vital signs:  Vitals:   01/08/18 0315 01/08/18 0550  BP:  118/61  Pulse: 98 85  Resp:  (!) 24  Temp:  98 F (36.7 C)  SpO2: 96% 95%    Recent laboratory studies:  Lab Results  Component Value Date   HGB 12.8 (L) 12/26/2017   HGB 12.4 (L) 11/16/2016    HGB 12.9 (L) 11/15/2016   Lab Results  Component Value Date   WBC 8.0 12/26/2017   PLT 345 12/26/2017   Lab Results  Component Value Date   INR 1.27 01/08/2018   Lab Results  Component Value Date   NA 137 12/26/2017   K 3.5 12/26/2017   CL 98 (L) 12/26/2017   CO2 30 12/26/2017   BUN 16 12/26/2017   CREATININE 0.86 12/26/2017   GLUCOSE 110 (H) 12/26/2017    Discharge Medications:   Allergies as of 01/08/2018      Reactions   Eliquis [apixaban] Other (See Comments)   causes nose bleeds   Oxycodone-acetaminophen Other (See Comments)   hallucinations   Penicillins Rash   Childhood allergy Has patient had a PCN reaction causing immediate rash, facial/tongue/throat swelling, SOB or lightheadedness with hypotension: No Has patient had a PCN reaction causing severe rash involving mucus membranes or skin necrosis: No Has patient had a PCN reaction that required hospitalization: No Has patient had a PCN reaction occurring within the last 10 years: No If all of the above answers are "NO", then may proceed with Cephalosporin use.      Medication List    TAKE these medications   atenolol 25 MG tablet Commonly known as:  TENORMIN Take 25 mg by mouth daily.   atorvastatin 40 MG tablet Commonly known  as:  LIPITOR Take 20 mg by mouth daily. Takes 0.5 tablet   diltiazem 120 MG tablet Commonly known as:  CARDIZEM Take 120 mg by mouth daily.   hydrocortisone 2.5 % cream Apply topically.   lisinopril 20 MG tablet Commonly known as:  PRINIVIL,ZESTRIL Take 20 mg by mouth daily. In am.   tapentadol 50 MG tablet Commonly known as:  NUCYNTA Take 1 tablet (50 mg total) by mouth every 4 (four) hours as needed for moderate pain.   traMADol 50 MG tablet Commonly known as:  ULTRAM Take 1-2 tablets (50-100 mg total) by mouth every 4 (four) hours as needed for moderate pain.   warfarin 2 MG tablet Commonly known as:  COUMADIN Take by mouth.            Durable Medical  Equipment  (From admission, onward)        Start     Ordered   01/07/18 1612  DME Walker rolling  Once    Question:  Patient needs a walker to treat with the following condition  Answer:  Total knee replacement status   01/07/18 1611   01/07/18 1612  DME Bedside commode  Once    Question:  Patient needs a bedside commode to treat with the following condition  Answer:  Total knee replacement status   01/07/18 1611      Diagnostic Studies: Dg Chest Port 1 View  Result Date: 01/08/2018 CLINICAL DATA:  Abnormal respiration EXAM: PORTABLE CHEST 1 VIEW COMPARISON:  None. FINDINGS: The heart size and mediastinal contours are within normal limits. Both lungs are clear. Comminuted fracture of the right humeral head. IMPRESSION: 1. No active cardiopulmonary disease. 2. Comminuted fracture of the right humeral head. Electronically Signed   By: Ulyses Jarred M.D.   On: 01/08/2018 03:37   Dg Knee Left Port  Result Date: 01/07/2018 CLINICAL DATA:  Status post left knee replacement today. EXAM: PORTABLE LEFT KNEE - 1-2 VIEW COMPARISON:  None. FINDINGS: Left total knee arthroplasty is in place. The device is located. No fracture. Surgical drain and staples are noted. IMPRESSION: Status post left total knee replacement.  No acute finding. Electronically Signed   By: Inge Rise M.D.   On: 01/07/2018 15:37    Disposition: 01-Home or Self Care  Discharge Instructions    Diet - low sodium heart healthy   Complete by:  As directed    Increase activity slowly   Complete by:  As directed       Follow-up Information    Watt Climes, PA On 01/22/2018.   Specialty:  Physician Assistant Why:  at 1:45pm Contact information: [place] [address] [phone]        Dereck Leep, MD On 02/19/2018.   Specialty:  Orthopedic Surgery Why:  at 10:45am Contact information: [place]  [address] [phone]            Signed: Watt Climes 01/08/2018, 7:48 AM

## 2018-01-08 NOTE — Progress Notes (Signed)
Patient moved to room 152 to be closer to nursing station

## 2018-01-08 NOTE — Clinical Social Work Placement (Signed)
   CLINICAL SOCIAL WORK PLACEMENT  NOTE  Date:  01/08/2018  Patient Details  Name: [name] MRN: [id number] Date of Birth: [birth year]  Clinical Social Work is seeking post-discharge placement for this patient at the [place] level of care (*CSW will initial, date and re-position this form in  chart as items are completed):  Yes   Patient/family provided with [facility] Work Department's list of facilities offering this level of care within the geographic area requested by the patient (or if unable, by the patient's family).  Yes   Patient/family informed of their freedom to choose among providers that offer the needed level of care, that participate in Medicare, Medicaid or managed care program needed by the patient, have an available bed and are willing to accept the patient.  Yes   Patient/family informed of [facility]'s ownership interest in [facility] and [facility], as well as of the fact that they are under no obligation to receive care at these facilities.  PASRR submitted to EDS on 01/08/18     PASRR number received on 01/08/18     Existing PASRR number confirmed on       FL2 transmitted to all facilities in geographic area requested by pt/family on 01/08/18     FL2 transmitted to all facilities within larger geographic area on       Patient informed that his/her managed care company has contracts with or will negotiate with certain facilities, including the following:        Yes   Patient/family informed of bed offers received.  Patient chooses bed at [facility] )     Physician recommends and patient chooses bed at      Patient to be transferred to   on  .  Patient to be transferred to facility by       Patient family notified on   of transfer.  Name of family member notified:        PHYSICIAN       Additional Comment:    _______________________________________________ Javana Schey, Veronia Beets,  LCSW 01/08/2018, 2:27 PM

## 2018-01-08 NOTE — Progress Notes (Signed)
Paged on call ortho Dr. Leim Fabry. Patient is Alert Newly discovered pt states he drinks about a pint of liquor per day.  Patient states he is anxious, had moderate/severe tremors, respirations around 25.  Pulling off gown and trying to jump out of bed.  spo2 is 78 on room air added 2L o2.  And spo2 increased to 94%. Stat chest xray ordered. Placed on ciwa and urine alcohol level obtained.

## 2018-01-08 NOTE — Progress Notes (Signed)
Patient has rhonchi breath sounds bilat.  Incentive spirometer encouraged with cough and deep breathing.  O2 sats.  At 94% on room air and head elevated.  Respiratory called to check on patient.  Will cont to monitor.

## 2018-01-08 NOTE — Progress Notes (Signed)
ORTHOPAEDICS PROGRESS NOTE  PATIENT NAME: [name] DOB: [birth year]  MRN: [id number]  POD # 1: Left total knee arthroplasty  Subjective: The patient became agitated last night and was placed on CIWA protocol.  He does have a history of daily alcohol use. Ativan was given early this morning. The patient was somewhat difficult to arouse.  He denied any significant pain.  Objective: Vital signs in last 24 hours: Temp:  [97 F (36.1 C)-98.4 F (36.9 C)] 98 F (36.7 C) (01/22 0550) Pulse Rate:  [66-102] 85 (01/22 0550) Resp:  [16-32] 24 (01/22 0550) BP: (118-164)/(55-83) 118/61 (01/22 0550) SpO2:  [75 %-99 %] 95 % (01/22 0550) Weight:  [70.8 kg (156 lb)] 70.8 kg (156 lb) (01/21 1622)  Intake/Output from previous day: 01/21 0701 - 01/22 0700 In: 4633.3 [P.O.:1620; I.V.:2463.3; IV Piggyback:550] Out: 1310 [Urine:1000; Drains:260; Blood:50]  Recent Labs    01/07/18 1652 01/08/18 0351  INR 1.07 1.27    EXAM General: Well-developed well-nourished male seen in no apparent discomfort. Lungs: clear to auscultation Cardiac: normal rate and regular rhythm Left lower extremity: Bone foam and Polar Care in place.  Knee dressing is dry and intact.  Good capillary refill.  Homans test is negative. Neurologic: Sensory and motor function are grossly intact.   Radiographs: Chest x-ray was ordered by Dr. Posey Pronto.  No active disease.  No evidence of pneumonia.  Assessment: Left total knee arthroplasty  Secondary diagnoses: History of daily alcohol use Nicotine dependence Peripheral vascular disease Hypertension Hyperlipidemia COPD  Plan: Continue with CIWA protocol. NicoDerm patch ordered. On Lovenox bridge while Coumadin therapy is re-initiated. Begin physical therapy and occupational therapy as per total knee arthroplasty rehab protocol. Discharge planning underway with consideration of Skilled Nursing versus home. DVT Prophylaxis - Lovenox, Coumadin, Foot Pumps and TED  hose  Aliya Sol P. Holley Bouche M.D.

## 2018-01-09 ENCOUNTER — Inpatient Hospital Stay
Admission: RE | Admit: 2018-01-09 | Discharge: 2018-01-09 | Disposition: A | Discharge status: Home / Self Care | Payer: Medicare PPO | Source: Ambulatory Visit | Attending: Internal Medicine | Admitting: Internal Medicine

## 2018-01-09 ENCOUNTER — Inpatient Hospital Stay: Payer: Medicare PPO

## 2018-01-09 LAB — BASIC METABOLIC PANEL
ANION GAP: 11 (ref 5–15)
BUN: 17 mg/dL (ref 6–20)
CO2: 25 mmol/L (ref 22–32)
Calcium: 8.5 mg/dL — ABNORMAL LOW (ref 8.9–10.3)
Chloride: 101 mmol/L (ref 101–111)
Creatinine, Ser: 1.04 mg/dL (ref 0.61–1.24)
GLUCOSE: 203 mg/dL — AB (ref 65–99)
POTASSIUM: 3.8 mmol/L (ref 3.5–5.1)
SODIUM: 137 mmol/L (ref 135–145)

## 2018-01-09 LAB — ECHOCARDIOGRAM COMPLETE
Height: 67 in
WEIGHTICAEL: 2726.65 [oz_av]

## 2018-01-09 LAB — PROTIME-INR
INR: 1.4
Prothrombin Time: 17 seconds — ABNORMAL HIGH (ref 11.4–15.2)

## 2018-01-09 LAB — MAGNESIUM: MAGNESIUM: 2.1 mg/dL (ref 1.7–2.4)

## 2018-01-09 NOTE — Progress Notes (Signed)
[facility] [place] NAME: [name]    MR#:  [id number]  DATE OF BIRTH:  [birth year]  SUBJECTIVE:  CHIEF COMPLAINT:  No chief complaint on file. medical consult yesterday for SOB, transferred to [facility]. Stable now, Off librium drip and off oxygen now. Much better today as per his family in room.  REVIEW OF SYSTEMS:  CONSTITUTIONAL: No fever, fatigue or weakness.  EYES: No blurred or double vision.  EARS, NOSE, AND THROAT: No tinnitus or ear pain.  RESPIRATORY: No cough, shortness of breath, wheezing or hemoptysis.  CARDIOVASCULAR: No chest pain, orthopnea, edema.  GASTROINTESTINAL: No nausea, vomiting, diarrhea or abdominal pain.  GENITOURINARY: No dysuria, hematuria.  ENDOCRINE: No polyuria, nocturia,  HEMATOLOGY: No anemia, easy bruising or bleeding SKIN: No rash or lesion. MUSCULOSKELETAL: No joint pain or arthritis.   NEUROLOGIC: No tingling, numbness, weakness.  PSYCHIATRY: No anxiety or depression.   ROS  DRUG ALLERGIES:   Allergies  Allergen Reactions  . Eliquis [Apixaban] Other (See Comments)    causes nose bleeds  . Oxycodone-Acetaminophen Other (See Comments)    hallucinations  . Penicillins Rash    Childhood allergy Has patient had a PCN reaction causing immediate rash, facial/tongue/throat swelling, SOB or lightheadedness with hypotension: No Has patient had a PCN reaction causing severe rash involving mucus membranes or skin necrosis: No Has patient had a PCN reaction that required hospitalization: No Has patient had a PCN reaction occurring within the last 10 years: No If all of the above answers are "NO", then may proceed with Cephalosporin use.     VITALS:  Blood pressure (!) 106/56, pulse 88, temperature 98.5 F (36.9 C), temperature source Oral, resp. rate (!) 26, height 5\' 7"  (1.702 m), weight 77.3 kg (170 lb 6.7 oz), SpO2 95 %.  PHYSICAL EXAMINATION:  GENERAL:  77 y.o.-year-old patient lying in the  bed with no acute distress.  EYES: Pupils equal, round, reactive to light and accommodation. No scleral icterus. Extraocular muscles intact.  HEENT: Head atraumatic, normocephalic. Oropharynx and nasopharynx clear.  NECK:  Supple, no jugular venous distention. No thyroid enlargement, no tenderness.  LUNGS: Normal breath sounds bilaterally, no wheezing, rales,rhonchi or crepitation. No use of accessory muscles of respiration.  CARDIOVASCULAR: S1, S2 normal. No murmurs, rubs, or gallops.  ABDOMEN: Soft, nontender, nondistended. Bowel sounds present. No organomegaly or mass.  EXTREMITIES: No pedal edema, cyanosis, or clubbing.  NEUROLOGIC: Cranial nerves II through XII are intact. Muscle strength 5/5 in all extremities. Sensation intact. Gait not checked. No tremors PSYCHIATRIC: The patient is alert and oriented x 3.  SKIN: No obvious rash, lesion, or ulcer.   Physical Exam LABORATORY PANEL:   CBC Recent Labs  Lab 01/08/18 0800  WBC 12.0*  HGB 10.9*  HCT 31.7*  PLT 250   ------------------------------------------------------------------------------------------------------------------  Chemistries  Recent Labs  Lab 01/09/18 0415  NA 137  K 3.8  CL 101  CO2 25  GLUCOSE 203*  BUN 17  CREATININE 1.04  CALCIUM 8.5*  MG 2.1   ------------------------------------------------------------------------------------------------------------------  Cardiac Enzymes No results for input(s): TROPONINI in the last 168 hours. ------------------------------------------------------------------------------------------------------------------  RADIOLOGY:  Ct Angio Chest Pe W Or Wo Contrast  Result Date: 01/08/2018 CLINICAL DATA:  Shortness of breath and hypoxia with recent knee surgery. High pretest probability for pulmonary embolus. EXAM: CT ANGIOGRAPHY CHEST WITH CONTRAST TECHNIQUE: Multidetector CT imaging of the chest was performed using the standard protocol during bolus administration of  intravenous contrast. Multiplanar CT image  reconstructions and MIPs were obtained to evaluate the vascular anatomy. CONTRAST:  25mL ISOVUE-370 IOPAMIDOL (ISOVUE-370) INJECTION 76% COMPARISON:  No comparison studies available. FINDINGS: Cardiovascular: The heart is enlarged. Coronary artery calcification is evident. Atherosclerotic calcification is noted in the wall of the thoracic aorta. Enlargement of the pulmonary arteries suggests underlying pulmonary arterial hypertension. No filling defect in the opacified pulmonary arteries to suggest the presence of an acute pulmonary embolus. Mediastinum/Nodes: No mediastinal lymphadenopathy. Upper normal lymph nodes are seen in the inferior right hilum. No hilar lymphadenopathy There is no axillary lymphadenopathy. The esophagus has normal imaging features. Lungs/Pleura: Centrilobular paraseptal emphysema noted. Bibasilar collapse/consolidation evident, right slightly more than left. No overt airspace pulmonary edema. Tiny bilateral pleural effusions are evident. Upper Abdomen: The liver shows diffusely decreased attenuation suggesting steatosis. Musculoskeletal: Bone windows reveal no worrisome lytic or sclerotic osseous lesions. Review of the MIP images confirms the above findings. IMPRESSION: 1. No CT evidence for acute pulmonary embolus. 2. Bibasilar collapse/consolidation with tiny bilateral pleural effusions. 3. Pulmonary arterial enlargement raises the question of pulmonary arterial hypertension. 4. Coronary artery and Aortic Atherosclerois (ICD10-170.0) 5.  Emphysema. ([id number]) Electronically Signed   By: Misty Stanley M.D.   On: 01/08/2018 13:16   Dg Chest Port 1 View  Result Date: 01/08/2018 CLINICAL DATA:  Acute respiratory failure EXAM: PORTABLE CHEST 1 VIEW COMPARISON:  01/08/2018 FINDINGS: Cardiac shadow is stable. Aortic calcifications are again seen. Bibasilar atelectatic changes are noted increased from the prior exam. Old rib fractures with healing  on the left are seen. IMPRESSION: Bibasilar atelectatic changes increased from the prior exam. Electronically Signed   By: Inez Catalina M.D.   On: 01/08/2018 10:27   Dg Chest Port 1 View  Result Date: 01/08/2018 CLINICAL DATA:  Abnormal respiration EXAM: PORTABLE CHEST 1 VIEW COMPARISON:  None. FINDINGS: The heart size and mediastinal contours are within normal limits. Both lungs are clear. Comminuted fracture of the right humeral head. IMPRESSION: 1. No active cardiopulmonary disease. 2. Comminuted fracture of the right humeral head. Electronically Signed   By: Ulyses Jarred M.D.   On: 01/08/2018 03:37   Dg Knee Left Port  Result Date: 01/07/2018 CLINICAL DATA:  Status post left knee replacement today. EXAM: PORTABLE LEFT KNEE - 1-2 VIEW COMPARISON:  None. FINDINGS: Left total knee arthroplasty is in place. The device is located. No fracture. Surgical drain and staples are noted. IMPRESSION: Status post left total knee replacement.  No acute finding. Electronically Signed   By: Inge Rise M.D.   On: 01/07/2018 15:37   US Abdomen Limited Ruq  Result Date: 01/09/2018 CLINICAL DATA:  Alcoholic cirrhosis EXAM: ULTRASOUND ABDOMEN LIMITED RIGHT UPPER QUADRANT COMPARISON:  CT abdomen pelvis 11/14/2016 FINDINGS: Gallbladder: 10 mm gallstone in the neck of the gallbladder. Gallbladder wall 1.9 mm, normal. Negative sonographic Murphy sign Common bile duct: Diameter: 3.2 mm Liver: Liver is mildly hyperechoic diffusely without focal abnormality. Small amount of fluid adjacent to the left lobe liver. Portal vein is patent on color Doppler imaging with normal direction of blood flow towards the liver. IMPRESSION: Cholelithiasis without cholecystitis Hyperechoic liver without focal lesion. Electronically Signed   By: Franchot Gallo M.D.   On: 01/09/2018 10:07    ASSESSMENT AND PLAN:   Active Problems:   S/P total knee arthroplasty  * Acute respiratory failure with hypoxia Due to COPD exacerbation,  responded nicely to supplemental oxygen, IV steroid and nebulizer therapy. CT scan of the chest is negative for PE. Echocardiogram is done.      *  Possible acute diastolic congestive heart failure   Will hold IV fluids for now, CT scan will give some idea about his pulmonary edema.   Echocardiogram.  * Hypercoagulable state- status post mesenteric ischemia   Was on Coumadin,  with resuming now postoperative.   * Alcohol abuse   Likely alcohol withdrawal   On CIWA protocol,  required IV drip in the stepdown unit yesterday, stable now.  * Hypertension   As blood pressure is running soft, I would continue atenolol but hold lisinopril for now.  * Hypokalemia   Likely secondary to alcoholism, check magnesium. Replace oral potassium.    All the records are reviewed and case discussed with Care Management/Social Workerr. Management plans discussed with the patient, family and they are in agreement.  CODE STATUS: Full code.  TOTAL TIME TAKING CARE OF THIS PATIENT: 35 minutes.   Discussed with family in ICU physician.  POSSIBLE D/C IN *1-2 DAYS, DEPENDING ON CLINICAL CONDITION.   Vaughan Basta M.D on 01/09/2018   Between 7am to 6pm - Pager - [phone]  After 6pm go to [email or web address] - password [email or web address] [facility]  Office  [phone]  CC: Primary care physician; Patient, No Pcp Per  Note: This dictation was prepared with Dragon dictation along with smaller phrase technology. Any transcriptional errors that result from this process are unintentional.

## 2018-01-09 NOTE — Care Management Note (Addendum)
Case Management Note  Patient Details  Name: [name] MRN: [id number] Date of Birth: 10-Aug-1941  Subjective/Objective:                  Met with patient (that slept during the conversation), his sister, and his granddaughter to discuss transition of care. They would like for patient to return home. He will need a rolling walker and bedside commode per granddaughter. They would like to use [facility] for home health services.  Patient would benefit from [facility] for PT/INR, HHPT, and HHSW. He uses Engineer, structural. Per granddaughter family will make arrangements to stay with patient at home.   Action/Plan:   Home health list provided. Referral to [facility]. Referral to [facility] for DME.   Expected Discharge Date:                  Expected Discharge Plan:     In-House Referral:     Discharge planning Services  CM Consult  Post Acute Care Choice:  Home Health, Durable Medical Equipment Choice offered to:  Adult Children, Sibling  DME Arranged:  3-N-1, Walker rolling DME Agency:  [place]:  PT, Social Work, Therapist, sports [place] Agency:  [facility] [facility] [employer]  Status of Service:  In process, will continue to follow  If discussed at Long Length of Stay Meetings, dates discussed:    Additional Comments:  Marshell Garfinkel, RN 01/09/2018, 10:56 AM

## 2018-01-09 NOTE — Progress Notes (Signed)
Pt transferred to 1A room 136 around 5:15pm. Pt is alert, A&Ox4, in no acute distress, VSS. Pt is on RA with O2 saturations ranging mid-high 90's. Pt has tremor to upper extremities that he and his granddaughter say he has at baseline. Pt reports a nonproductive cough. Pt denies pain to left knee, bone foam is in place. Pt and granddaughter oriented to unit routines.   [place]ee, Jerry Caras

## 2018-01-09 NOTE — Progress Notes (Signed)
Clinical Education officer, museum (CSW) faxed requested clinicals to [employer].   [employer], LCSW [phone]

## 2018-01-09 NOTE — Progress Notes (Signed)
   Subjective: 2 Days Post-Op Procedure(s) (LRB): COMPUTER ASSISTED TOTAL KNEE ARTHROPLASTY (Left) Patient not able to give a pain score rating. Appears to be resting well. Able to awaken and talk some. Continues to fall back to sleep Day 2 in ICU 2/2 hypoxic. No longer on machine We will start therapy today if cleared by medicine Plan is to go Rehab after hospital stay. no nausea and no vomiting Patient denies any chest pains or shortness of breath. Objective: Vital signs in last 24 hours: Temp:  [97.1 F (36.2 C)-98.3 F (36.8 C)] 97.1 F (36.2 C) (01/22 1900) Pulse Rate:  [56-98] 72 (01/23 0600) Resp:  [17-27] 24 (01/23 0600) BP: (89-129)/(52-66) 120/61 (01/23 0600) SpO2:  [90 %-99 %] 95 % (01/23 0600) Weight:  [77.3 kg (170 lb 6.7 oz)] 77.3 kg (170 lb 6.7 oz) (01/22 1339) well approximated incision Heels are non tender and elevated off the bed using rolled towels Intake/Output from previous day: 01/22 0701 - 01/23 0700 In: 233.3 [I.V.:183.3; IV Piggyback:50] Out: 2695 [Urine:2575; Drains:120] Intake/Output this shift: No intake/output data recorded.  Recent Labs    01/08/18 0800  HGB 10.9*   Recent Labs    01/08/18 0800  WBC 12.0*  RBC 3.13*  HCT 31.7*  PLT 250   Recent Labs    01/08/18 0800 01/09/18 0415  NA 131* 137  K 3.4* 3.8  CL 96* 101  CO2 26 25  BUN 17 17  CREATININE 0.93 1.04  GLUCOSE 100* 203*  CALCIUM 7.9* 8.5*   Recent Labs    01/08/18 0351 01/09/18 0415  INR 1.27 1.40    EXAM General - Patient is Alert Extremity - Neurologically intact Neurovascular intact Sensation intact distally Intact pulses distally No cellulitis present Compartment soft Dressing - scant drainage Motor Function - intact, moving foot and toes well on exam.    Past Medical History:  Diagnosis Date  . COPD (chronic obstructive pulmonary disease) ([place])   . HLD (hyperlipidemia)   . Hypertension   . PVD (peripheral vascular disease) ([place])   . Vomiting     and abd pain    Assessment/Plan: 2 Days Post-Op Procedure(s) (LRB): COMPUTER ASSISTED TOTAL KNEE ARTHROPLASTY (Left) Active Problems:   S/P total knee arthroplasty  Estimated body mass index is 26.69 kg/m as calculated from the following:   Height as of this encounter: 5\' 7"  (1.702 m).   Weight as of this encounter: 77.3 kg (170 lb 6.7 oz). Discharge to SNF  Labs: reviewed DVT Prophylaxis - Lovenox, Coumadin, Foot Pumps and TED hose Weight-Bearing as tolerated to left leg D/C O2 and Pulse OX and try on Room Air Pt needs a bowel movement hemovac d/c'd today. End of hemovac appeared to be intact Dressing changed Please apply TED stockings to both legs  [name] R. [place] 01/09/2018, 7:06 AM

## 2018-01-09 NOTE — Progress Notes (Signed)
PULMONARY / CRITICAL CARE MEDICINE   Name: [name] MRN: [id number] DOB: 1941-09-04    ADMISSION DATE:  01/07/2018 CONSULTATION DATE:  01/08/2018   REFERRING MD:  Dr. Anselm Jungling  CHIEF COMPLAINT:  Shortness of breath/hypoxia  HISTORY OF PRESENT ILLNESS:   Remains encephalopathic On precedex Will wean when family arives   REVIEW OF SYSTEMS:   Not done 2/2 pt confusion  VITAL SIGNS: BP 120/61   Pulse 72   Temp (!) 97.1 F (36.2 C) (Axillary)   Resp (!) 24   Ht 5\' 7"  (1.702 m)   Wt 170 lb 6.7 oz (77.3 kg)   SpO2 97%   BMI 26.69 kg/m     INTAKE / OUTPUT: I/O last 3 completed shifts: In: 2169.9 [P.O.:660; I.V.:1159.9; IV Piggyback:350] Out: 2482 [Urine:3350; Drains:380]  PHYSICAL EXAMINATION: General:  Ill-appearing  Neuro:  Responsive to voice, incoherent speech, +tremor. GCS 12 HEENT:  NCAT, PERRL Cardiovascular:  S1S2, RRR, no m/g/r 2+ peripheral pulses, no LE edema Lungs:  BS decreased with +wheezing and coarse crackles. No accessory muscle use Abdomen:  Soft, non-distended Musculoskeletal:  No gross deformity Skin:  Warm dry acyanotic. No lesions.  LABS:  BMET Recent Labs  Lab 01/08/18 0800 01/09/18 0415  NA 131* 137  K 3.4* 3.8  CL 96* 101  CO2 26 25  BUN 17 17  CREATININE 0.93 1.04  GLUCOSE 100* 203*    Electrolytes Recent Labs  Lab 01/08/18 0352 01/08/18 0800 01/09/18 0415  CALCIUM  --  7.9* 8.5*  MG 1.2*  --  2.1    CBC Recent Labs  Lab 01/08/18 0800  WBC 12.0*  HGB 10.9*  HCT 31.7*  PLT 250    Coag's Recent Labs  Lab 01/07/18 1652 01/08/18 0351 01/09/18 0415  INR 1.07 1.27 1.40    Sepsis Markers No results for input(s): LATICACIDVEN, PROCALCITON, O2SATVEN in the last 168 hours.  ABG Recent Labs  Lab 01/08/18 1323  PHART 7.39  PCO2ART 48  PO2ART 88    Liver Enzymes No results for input(s): AST, ALT, ALKPHOS, BILITOT, ALBUMIN in the last 168 hours.  Cardiac Enzymes No results for input(s): TROPONINI,  PROBNP in the last 168 hours.  Glucose Recent Labs  Lab 01/08/18 1320  GLUCAP 89    Imaging   STUDIES:  1/22 CT-A chest no evidence of PE   SIGNIFICANT EVENTS: 1/21 Left knee arthroplasty performed, pt admitted to medical floor 1/22 Pt developed respiratory distress and tremors with agitation. Rapid response called on the floor for desaturation to the 70s requiring NRB. Pt admitted to ICU for further management.   DISCUSSION: Pt with significant history of heavy alcohol use who has been inpatient for >24 hours. Alcohol level <10. Agitation and tremor c/w DTs. Pt also with hx of COPD and wheezing on exam, also with rhonchi concerning for aspiration.  ASSESSMENT / PLAN: 77 year-old male s/p left knee arthroplasty with acute alcohol withdrawal and acute hypoxic respiratory failure requiring NRB. Treatment of withdrawal complicated by respiratory depression with COPD exacerbation.   PULMONARY A: Acute hypoxic respiratory failure AECOPD  P:   Continue supplemental oxygen spO2 goal 88-92% Bronchodilators IV Steroids Inhaled steroids  CARDIOVASCULAR A:  Cardiomegaly in the setting of alcohol abuse and probable pulmonary hypertension Elevated BNP P:  Echocardiogram pending  RENAL A:  Hypokalemia and hypomagnesemia, likely 2/2 chronic alcohol use   P:   Follow up labs  GASTROINTESTINAL A:   No issues P:   SUP Keep NPO for now  High risk for aspiration  HEMATOLOGIC A:   No issues P:  Warfarin restarted [place] with lovenox per pharmacy  ENDOCRINE A:   No problems   P:   CBG monitoring QD  NEUROLOGIC A:   Acute encephalopathy 2/2 alcohol withdrawal P:   Continue CIWA protocol On precedex infusion -will wean when family at bedside    Critical Care Time devoted to patient care services described in this note is 35 minutes.   Overall, patient is critically ill, prognosis is guarded.  Patient with Multiorgan failure and at high risk for cardiac  arrest and death.    Corrin Parker, M.D.  [facility]  Medical Director [place] Director [facility] Cardio-Pulmonary Department

## 2018-01-09 NOTE — Progress Notes (Signed)
   To Whom It May Concern,       Mr. [name] is admitted to the ICU on 01/07/18. He is in critical condition but stable. Family members are encouraged to come visit.     Corrin Parker, M.D.  [facility]  Medical Director [place] Director [facility] Cardio-Pulmonary Department

## 2018-01-09 NOTE — Progress Notes (Signed)
Pt much improved this shift. Per granddaughter, pt is at his baseline. Precedex drip was stopped at 1100 this morning, pt has been alert, oriented, and calm since then. Pt has been requesting to move to a floor room since early this afternoon. Foley catheter d/c'd per Dr. Marry Guan. Pt reports that he needs to use the bathroom but wants to wait till he gets to his floor room. Urinal provided. Will continue to monitor.

## 2018-01-09 NOTE — Progress Notes (Signed)
*  PRELIMINARY RESULTS* Echocardiogram 2D Echocardiogram has been performed.  [name] 01/09/2018, 9:26 AM

## 2018-01-10 LAB — CBC
HEMATOCRIT: 31.7 % — AB (ref 40.0–52.0)
HEMOGLOBIN: 10.8 g/dL — AB (ref 13.0–18.0)
MCH: 34.3 pg — ABNORMAL HIGH (ref 26.0–34.0)
MCHC: 34 g/dL (ref 32.0–36.0)
MCV: 101 fL — AB (ref 80.0–100.0)
Platelets: 306 10*3/uL (ref 150–440)
RBC: 3.14 MIL/uL — ABNORMAL LOW (ref 4.40–5.90)
RDW: 14.7 % — AB (ref 11.5–14.5)
WBC: 15.4 10*3/uL — AB (ref 3.8–10.6)

## 2018-01-10 LAB — PROTIME-INR
INR: 1.36
Prothrombin Time: 16.7 seconds — ABNORMAL HIGH (ref 11.4–15.2)

## 2018-01-10 MED ORDER — HALOPERIDOL LACTATE 5 MG/ML IJ SOLN: 1.0000 mg | Freq: Once | INTRAMUSCULAR | Status: DC

## 2018-01-10 MED ORDER — PREDNISONE 10 MG PO TABS: 10.0000 mg | ORAL_TABLET | Freq: Every day | ORAL | Status: DC

## 2018-01-10 NOTE — Evaluation (Signed)
Occupational Therapy Evaluation Patient Details Name: RAM HAUGAN MRN: [id number] DOB: 1941-08-07 Today's Date: 01/10/2018    History of Present Illness 77 y.o.malewith a known history of COPD, hyperlipidemia, hypertension, peripheral vascular disease, mesenteric ischemia and status post vascular stenting-on Coumadin at home. S/p elective left knee arthroplasty 1/21. He was transferred to the CCU, returns to ortho floor with orders for OT.   Clinical Impression   Pt is 77 year old male POD#3 s/p L TKR.  Pt was independent in all ADLs prior to surgery and is eager to return to PLOF.  Pt currently requires min assist for LB dressing while in seated position due to pain and limited AROM of L knee. Pt/family verbalized understanding of education in compression stocking mgt, polar care mgt, and DME for toileting/bathing to maximize safety/independence. Pt would benefit from additional instruction in dressing techniques with or without assistive devices for dressing and bathing skills.  Pt would also benefit from recommendations for home modifications to increase safety in the bathroom and prevent falls. Recommend OT HH following this hospitalization as well as a BSC to improve safety and independence with toilet transfers and seated shower.      Follow Up Recommendations  Home health OT    Equipment Recommendations  3 in 1 bedside commode    Recommendations for Other Services       Precautions / Restrictions Precautions Precautions: Knee Precaution Booklet Issued: No Restrictions Weight Bearing Restrictions: Yes LLE Weight Bearing: Weight bearing as tolerated      Mobility Bed Mobility     General bed mobility comments: deferred, up in recliner  Transfers Overall transfer level: Modified independent Equipment used: Rolling walker (2 wheeled) Transfers: Sit to/from Stand Sit to Stand: Supervision         General transfer comment: Pt did well getting to standing, minimal  cuing for set up and sequencing    Balance Overall balance assessment: Modified Independent Sitting-balance support: Feet unsupported;Feet supported;Bilateral upper extremity supported Sitting balance-Leahy Scale: Good     Standing balance support: Bilateral upper extremity supported Standing balance-Leahy Scale: Fair                             ADL either performed or assessed with clinical judgement   ADL Overall ADL's : Needs assistance/impaired Eating/Feeding: Sitting;Set up   Grooming: Sitting;Set up   Upper Body Bathing: Sitting;Set up;Supervision/ safety   Lower Body Bathing: Sit to/from stand;Minimal assistance;Moderate assistance   Upper Body Dressing : Sitting;Set up;Supervision/safety   Lower Body Dressing: Sit to/from stand;Moderate assistance;Minimal assistance Lower Body Dressing Details (indicate cue type and reason): pt/family educated in use of AE for LB dressing, pt verbalized understanding decline to trial himself but requested his granddaughter purchase the AE for him Toilet Transfer: RW;Supervision/safety;Comfort height toilet;Ambulation           Functional mobility during ADLs: Supervision/safety;Rolling walker       Vision Baseline Vision/History: Wears glasses Wears Glasses: At all times Patient Visual Report: No change from baseline       Perception     Praxis      Pertinent Vitals/Pain Pain Assessment: 0-10 Pain Score: 3  Pain Location: L knee Pain Descriptors / Indicators: Aching;Sore;Operative site guarding Pain Intervention(s): Limited activity within patient's tolerance;Monitored during session;Ice applied     Hand Dominance Right   Extremity/Trunk Assessment Upper Extremity Assessment Upper Extremity Assessment: Generalized weakness   Lower Extremity Assessment Lower Extremity Assessment: Generalized  weakness LLE: Unable to fully assess due to pain   Cervical / Trunk Assessment Cervical / Trunk Assessment:  Normal   Communication Communication Communication: No difficulties   Cognition Arousal/Alertness: Awake/alert Behavior During Therapy: Restless Overall Cognitive Status: Within Functional Limits for tasks assessed                                 General Comments: pt required ocassional VC from family to attend to OT as pt was organizing his toiletries bag   General Comments       Exercises Other Exercises Other Exercises: pt/family educated in compression stocking mgt, polar care mgt, and DME for toileting/bathing to maximize safety/independence   Shoulder Instructions      Home Living Family/patient expects to be discharged to:: Private residence Living Arrangements: Alone Available Help at Discharge: Family;Friend(s);Available 24 hours/day Type of Home: House Home Access: Ramped entrance     Home Layout: Two level;Able to live on main level with bedroom/bathroom     Bathroom Shower/Tub: Occupational psychologist: Standard     Home Equipment: Cane - quad;Grab bars - tub/shower          Prior Functioning/Environment Level of Independence: Independent        Comments: Pt Ind with amb community distances, no fall history, Ind with ADLs.         OT Problem List: Decreased strength;Decreased knowledge of use of DME or AE;Decreased range of motion;Decreased activity tolerance;Impaired balance (sitting and/or standing);Decreased safety awareness;Pain      OT Treatment/Interventions: Self-care/ADL training;Balance training;Therapeutic exercise;Therapeutic activities;DME and/or AE instruction;Patient/family education    OT Goals(Current goals can be found in the care plan section) Acute Rehab OT Goals Patient Stated Goal: go home OT Goal Formulation: With patient/family Time For Goal Achievement: 01/24/18 Potential to Achieve Goals: Good ADL Goals Pt Will Perform Lower Body Dressing: sit to/from stand;with min guard assist;with adaptive  equipment Pt Will Transfer to Toilet: with supervision;ambulating(BSC over toilet, RW for ambulation)  OT Frequency: Min 2X/week   Barriers to D/C:            Co-evaluation              AM-PAC PT "6 Clicks" Daily Activity     Outcome Measure Help from another person eating meals?: None Help from another person taking care of personal grooming?: None Help from another person toileting, which includes using toliet, bedpan, or urinal?: A Little Help from another person bathing (including washing, rinsing, drying)?: A Little Help from another person to put on and taking off regular upper body clothing?: None Help from another person to put on and taking off regular lower body clothing?: A Lot 6 Click Score: 20   End of Session    Activity Tolerance: Patient tolerated treatment well Patient left: in chair;with call bell/phone within reach;with chair alarm set;with family/visitor present;Other (comment)(polar care in place)  OT Visit Diagnosis: Other abnormalities of gait and mobility (R26.89);Muscle weakness (generalized) (M62.81);Pain Pain - Right/Left: Left Pain - part of body: Knee                Time: 3532-9924 OT Time Calculation (min): 22 min Charges:  OT General Charges $OT Visit: 1 Visit OT Evaluation $OT Eval Low Complexity: 1 Low OT Treatments $Self Care/Home Management : 8-22 mins  Jeni Salles, MPH, MS, OTR/L ascom [phone] 01/10/18, 1:26 PM

## 2018-01-10 NOTE — Care Management (Signed)
Patient will need HHOT and HHa added to home health  orders

## 2018-01-10 NOTE — Progress Notes (Signed)
  ANTICOAGULATION CONSULT NOTE - Initial Consult  Pharmacy Consult for warfarin Indication: VTE ppx/atrial flutter per Dr. Nehemiah Massed  Allergies  Allergen Reactions  . Eliquis [Apixaban] Other (See Comments)    causes nose bleeds  . Oxycodone-Acetaminophen Other (See Comments)    hallucinations  . Penicillins Rash    Childhood allergy Has patient had a PCN reaction causing immediate rash, facial/tongue/throat swelling, SOB or lightheadedness with hypotension: No Has patient had a PCN reaction causing severe rash involving mucus membranes or skin necrosis: No Has patient had a PCN reaction that required hospitalization: No Has patient had a PCN reaction occurring within the last 10 years: No If all of the above answers are "NO", then may proceed with Cephalosporin use.     Patient Measurements: Height: 5\' 7"  (170.2 cm) Weight: 170 lb 6.7 oz (77.3 kg) IBW/kg (Calculated) : 66.1 Heparin Dosing Weight:   Vital Signs: Temp: 98.7 F (37.1 C) (01/24 0343) Temp Source: Oral (01/24 0343) BP: 129/88 (01/24 0946) Pulse Rate: 103 (01/24 0947)  Labs: Recent Labs    01/08/18 0351 01/08/18 0800 01/09/18 0415 01/10/18 0408  HGB  --  10.9*  --  10.8*  HCT  --  31.7*  --  31.7*  PLT  --  250  --  306  LABPROT 15.8*  --  17.0* 16.7*  INR 1.27  --  1.40 1.36  CREATININE  --  0.93 1.04  --     Estimated Creatinine Clearance: 56.5 mL/min (by C-G formula based on SCr of 1.04 mg/dL).   Medical History: Past Medical History:  Diagnosis Date  . COPD (chronic obstructive pulmonary disease) ([place])   . HLD (hyperlipidemia)   . Hypertension   . PVD (peripheral vascular disease) ([place])   . Vomiting    and abd pain   Assessment: 44 yom now s/p left TKA. Uses VKA PTA for atrial flutter. Pharmacy consulted to dose VKA for VTE ppx/to continue PTA dosing.  DATE INR DOSE 1/21 1.07 2mg  1/22 1.27 No warfarin given 1/23     1.40     2mg  1/24     1.36  Goal of Therapy:  INR 2-3 Monitor  platelets by anticoagulation protocol: Yes   Plan:  Continue PTA dose 2 mg po once daily. Expect that will begin to see INR trending back up to therapeutic range. Will follow INR daily until therapeutic x 2.  Paulina Fusi, PharmD, BCPS 01/10/2018 10:51 AM

## 2018-01-10 NOTE — Progress Notes (Signed)
Patient walked around the nurses station with PT this morning and PT is changing recommendation to home health. Clinical Education officer, museum (CSW) met with patient and his grand-daughter Marla Roe was at bedside. Patient and grand-daughter prefer to go home with home health. RN case manager aware of above. Please reconsult if future social work needs arise. CSW signing off.   [employer], LCSW [phone]

## 2018-01-10 NOTE — Progress Notes (Addendum)
   Subjective: 3 Days Post-Op Procedure(s) (LRB): COMPUTER ASSISTED TOTAL KNEE ARTHROPLASTY (Left) Patient reports pain as mild.   Patient is well, and has had no acute complaints or problems We will start therapy today.  Plan is to go Rehab after hospital stay. no nausea and no vomiting Patient denies any chest pains or shortness of breath. Patient much more alert and appears to be back to baseline. Sitting up in bed complaining about his overnight body. Able to carry on a conversation very well.  Objective: Vital signs in last 24 hours: Temp:  [98 F (36.7 C)-98.7 F (37.1 C)] 98.7 F (37.1 C) (01/24 0343) Pulse Rate:  [82-93] 93 (01/24 0343) Resp:  [16-26] 16 (01/24 0343) BP: (106-158)/(56-79) 153/79 (01/24 0343) SpO2:  [90 %-100 %] 95 % (01/24 0343) well approximated incision Heels are non tender and elevated off the bed using rolled towels Intake/Output from previous day: 01/23 0701 - 01/24 0700 In: 720 [P.O.:720] Out: -  Intake/Output this shift: No intake/output data recorded.  Recent Labs    01/08/18 0800 01/10/18 0408  HGB 10.9* 10.8*   Recent Labs    01/08/18 0800 01/10/18 0408  WBC 12.0* 15.4*  RBC 3.13* 3.14*  HCT 31.7* 31.7*  PLT 250 306   Recent Labs    01/08/18 0800 01/09/18 0415  NA 131* 137  K 3.4* 3.8  CL 96* 101  CO2 26 25  BUN 17 17  CREATININE 0.93 1.04  GLUCOSE 100* 203*  CALCIUM 7.9* 8.5*   Recent Labs    01/09/18 0415 01/10/18 0408  INR 1.40 1.36    EXAM General - Patient is Alert, Appropriate and Oriented Extremity - Neurologically intact Neurovascular intact Sensation intact distally Intact pulses distally Dorsiflexion/Plantar flexion intact No cellulitis present Compartment soft Dressing - scant drainage Motor Function - intact, moving foot and toes well on exam.    Past Medical History:  Diagnosis Date  . COPD (chronic obstructive pulmonary disease) ([place])   . HLD (hyperlipidemia)   . Hypertension   . PVD  (peripheral vascular disease) ([place])   . Vomiting    and abd pain    Assessment/Plan: 3 Days Post-Op Procedure(s) (LRB): COMPUTER ASSISTED TOTAL KNEE ARTHROPLASTY (Left) Active Problems:   S/P total knee arthroplasty  Estimated body mass index is 26.69 kg/m as calculated from the following:   Height as of this encounter: 5\' 7"  (1.702 m).   Weight as of this encounter: 77.3 kg (170 lb 6.7 oz). Up with therapy Plan for discharge tomorrow Discharge to SNF  Labs: Hemoglobin 10.8. INR 1.37 DVT Prophylaxis - Lovenox, Coumadin, Foot Pumps and TED hose Weight-Bearing as tolerated to left leg D/C O2 and Pulse OX and try on Room Air Patient needs to have a bowel movement today  Midori Dado R. [place] 01/10/2018, 7:49 AM

## 2018-01-10 NOTE — Progress Notes (Signed)
[facility] NAME: [name]    MR#:  [id number]  DATE OF BIRTH:  1941/08/25  SUBJECTIVE:  CHIEF COMPLAINT:  No chief complaint on file. medical consult yesterday for SOB, transferred to [facility]. Stable now, Off librium drip and off oxygen now. Much better today as per his family in room. He is somewhat anxious today, but manageable by talking with him.  REVIEW OF SYSTEMS:  CONSTITUTIONAL: No fever, fatigue or weakness.  EYES: No blurred or double vision.  EARS, NOSE, AND THROAT: No tinnitus or ear pain.  RESPIRATORY: No cough, shortness of breath, wheezing or hemoptysis.  CARDIOVASCULAR: No chest pain, orthopnea, edema.  GASTROINTESTINAL: No nausea, vomiting, diarrhea or abdominal pain.  GENITOURINARY: No dysuria, hematuria.  ENDOCRINE: No polyuria, nocturia,  HEMATOLOGY: No anemia, easy bruising or bleeding SKIN: No rash or lesion. MUSCULOSKELETAL: No joint pain or arthritis.   NEUROLOGIC: No tingling, numbness, weakness.  PSYCHIATRY: No anxiety or depression.   ROS  DRUG ALLERGIES:   Allergies  Allergen Reactions  . Eliquis [Apixaban] Other (See Comments)    causes nose bleeds  . Oxycodone-Acetaminophen Other (See Comments)    hallucinations  . Penicillins Rash    Childhood allergy Has patient had a PCN reaction causing immediate rash, facial/tongue/throat swelling, SOB or lightheadedness with hypotension: No Has patient had a PCN reaction causing severe rash involving mucus membranes or skin necrosis: No Has patient had a PCN reaction that required hospitalization: No Has patient had a PCN reaction occurring within the last 10 years: No If all of the above answers are "NO", then may proceed with Cephalosporin use.     VITALS:  Blood pressure 129/88, pulse (!) 103, temperature 98.5 F (36.9 C), temperature source Oral, resp. rate 16, height 5\' 7"  (1.702 m), weight 77.3 kg (170 lb 6.7 oz), SpO2 97 %.  PHYSICAL  EXAMINATION:  GENERAL:  77 y.o.-year-old patient lying in the bed with no acute distress.  EYES: Pupils equal, round, reactive to light and accommodation. No scleral icterus. Extraocular muscles intact.  HEENT: Head atraumatic, normocephalic. Oropharynx and nasopharynx clear.  NECK:  Supple, no jugular venous distention. No thyroid enlargement, no tenderness.  LUNGS: Normal breath sounds bilaterally, no wheezing, rales,rhonchi or crepitation. No use of accessory muscles of respiration.  CARDIOVASCULAR: S1, S2 normal. No murmurs, rubs, or gallops.  ABDOMEN: Soft, nontender, nondistended. Bowel sounds present. No organomegaly or mass.  EXTREMITIES: No pedal edema, cyanosis, or clubbing.  NEUROLOGIC: Cranial nerves II through XII are intact. Muscle strength 5/5 in all extremities. Sensation intact. Gait not checked. No tremors PSYCHIATRIC: The patient is alert and oriented x 3.  SKIN: No obvious rash, lesion, or ulcer.   Physical Exam LABORATORY PANEL:   CBC Recent Labs  Lab 01/10/18 0408  WBC 15.4*  HGB 10.8*  HCT 31.7*  PLT 306   ------------------------------------------------------------------------------------------------------------------  Chemistries  Recent Labs  Lab 01/09/18 0415  NA 137  K 3.8  CL 101  CO2 25  GLUCOSE 203*  BUN 17  CREATININE 1.04  CALCIUM 8.5*  MG 2.1   ------------------------------------------------------------------------------------------------------------------  Cardiac Enzymes No results for input(s): TROPONINI in the last 168 hours. ------------------------------------------------------------------------------------------------------------------  RADIOLOGY:  US Abdomen Limited Ruq  Result Date: 01/09/2018 CLINICAL DATA:  Alcoholic cirrhosis EXAM: ULTRASOUND ABDOMEN LIMITED RIGHT UPPER QUADRANT COMPARISON:  CT abdomen pelvis 11/14/2016 FINDINGS: Gallbladder: 10 mm gallstone in the neck of the gallbladder. Gallbladder wall 1.9 mm,  normal. Negative sonographic Murphy sign Common bile  duct: Diameter: 3.2 mm Liver: Liver is mildly hyperechoic diffusely without focal abnormality. Small amount of fluid adjacent to the left lobe liver. Portal vein is patent on color Doppler imaging with normal direction of blood flow towards the liver. IMPRESSION: Cholelithiasis without cholecystitis Hyperechoic liver without focal lesion. Electronically Signed   By: Franchot Gallo M.D.   On: 01/09/2018 10:07    ASSESSMENT AND PLAN:   Active Problems:   S/P total knee arthroplasty  * Acute respiratory failure with hypoxia  Due to COPD exacerbation, responded nicely to supplemental oxygen, IV steroid and  nebulizer therapy.  CT scan of the chest is negative for PE. Echocardiogram is done. EF 65%   Switch to oral steroids, as better now.      * Possible acute diastolic congestive heart failure   Will hold IV fluids for now, CT scan will give some idea about his pulmonary edema.   Echocardiogram.  * Hypercoagulable state- status post mesenteric ischemia   Was on Coumadin,  with resuming now postoperative.  * Alcohol abuse   Likely alcohol withdrawal    On CIWA protocol,  required IV drip in the stepdown unit yesterday, stable now.   Cont to give ativan, as needed to help with withdrawal.  * Hypertension   As blood pressure is running soft, I would continue atenolol but hold lisinopril for now.  * Hypokalemia   Likely secondary to alcoholism, check magnesium. Replace oral potassium.  * Elevated WBcs   Likely steroids   Check UA.  All the records are reviewed and case discussed with Care Management/Social Workerr. Management plans discussed with the patient, family and they are in agreement.  CODE STATUS: Full code.  TOTAL TIME TAKING CARE OF THIS PATIENT: 35 minutes.   Discussed with family in ICU physician.  POSSIBLE D/C IN *1-2 DAYS, DEPENDING ON CLINICAL CONDITION.   Vaughan Basta M.D on 01/10/2018    Between 7am to 6pm - Pager - [phone]  After 6pm go to [email or web address] - password [email or web address] [facility]  Office  [phone]  CC: Primary care physician; Patient, No Pcp Per  Note: This dictation was prepared with Dragon dictation along with smaller phrase technology. Any transcriptional errors that result from this process are unintentional.

## 2018-01-10 NOTE — Evaluation (Signed)
Physical Therapy Re-Evaluation Patient Details Name: [name] MRN: [id number] DOB: 05-Aug-1941 Today's Date: 01/10/2018   History of Present Illness  77 y.o.malewith a known history of COPD, hyperlipidemia, hypertension, peripheral vascular disease, mesenteric ischemia and status post vascular stenting-on Coumadin at home.  s/p elective left knee arthroplasty 1/21. He was transferred to the CCU, retruns to ortho floor with orders to continue PT.  Clinical Impression  Pt has, by far, his best PT session with good mobility getting to EOB, standing and ultimately (with gait training apart from exam and considerable cuing) was able to ambulate around the nurses' station, he also did well with ~15 minutes of exercises apart from exam and showed good quad control, general strength and mobility.     Follow Up Recommendations Home health PT    Equipment Recommendations  Rolling walker with 5" wheels    Recommendations for Other Services       Precautions / Restrictions Precautions Precautions: Knee Restrictions LLE Weight Bearing: Weight bearing as tolerated      Mobility  Bed Mobility Overal bed mobility: Needs Assistance Bed Mobility: Supine to Sit     Supine to sit: Supervision     General bed mobility comments: Pt able to rise to EOB w/o assist, showed good effort, minimal rail use  Transfers Overall transfer level: Modified independent Equipment used: Rolling walker (2 wheeled) Transfers: Sit to/from Stand           General transfer comment: Pt did well getting to standing, minimal cuing for set up and sequencing  Ambulation/Gait Ambulation/Gait assistance: Supervision Ambulation Distance (Feet): 200 Feet Assistive device: Rolling walker (2 wheeled)       General Gait Details: Pt showed good effort with ambulation and was able to maintain consistent walker motion much of the time.  He did have HR increase to 140s (though generally was in the 120s) and was  on 4 liters of O2 t/o the effort with sats in the mid 90s.  Stairs            Wheelchair Mobility    Modified Rankin (Stroke Patients Only)       Balance Overall balance assessment: Modified Independent                                           Pertinent Vitals/Pain Pain Assessment: No/denies pain    Home Living Family/patient expects to be discharged to:: Private residence Living Arrangements: Alone Available Help at Discharge: Family;Friend(s);Available 24 hours/day Type of Home: House Home Access: Ramped entrance     Home Layout: Two level;Able to live on main level with bedroom/bathroom        Prior Function Level of Independence: Independent         Comments: Pt Ind with amb community distances, no fall history, Ind with ADLs     Hand Dominance        Extremity/Trunk Assessment   Upper Extremity Assessment Upper Extremity Assessment: Generalized weakness    Lower Extremity Assessment Lower Extremity Assessment: Generalized weakness       Communication   Communication: No difficulties  Cognition Arousal/Alertness: Awake/alert Behavior During Therapy: Restless  General Comments      Exercises Total Joint Exercises Ankle Circles/Pumps: AROM;10 reps Quad Sets: Strengthening;15 reps Gluteal Sets: Strengthening;15 reps Short Arc Quad: Strengthening;15 reps Heel Slides: AROM;Strengthening;10 reps Hip ABduction/ADduction: Strengthening;10 reps Straight Leg Raises: AROM;10 reps Knee Flexion: PROM;5 reps Goniometric ROM: 1-98   Assessment/Plan    PT Assessment Patient needs continued PT services  PT Problem List Decreased strength;Decreased range of motion;Decreased activity tolerance;Decreased balance;Decreased knowledge of use of DME;Decreased mobility;Decreased safety awareness       PT Treatment Interventions DME instruction;Gait training;Functional  mobility training;Neuromuscular re-education;Balance training;Therapeutic exercise;Therapeutic activities;Patient/family education    PT Goals (Current goals can be found in the Care Plan section)  Acute Rehab PT Goals Patient Stated Goal: go home PT Goal Formulation: With patient Time For Goal Achievement: 01/21/18 Potential to Achieve Goals: Good    Frequency BID   Barriers to discharge        Co-evaluation               AM-PAC PT "6 Clicks" Daily Activity  Outcome Measure Difficulty turning over in bed (including adjusting bedclothes, sheets and blankets)?: None Difficulty moving from lying on back to sitting on the side of the bed? : A Little Difficulty sitting down on and standing up from a chair with arms (e.g., wheelchair, bedside commode, etc,.)?: A Little Help needed moving to and from a bed to chair (including a wheelchair)?: None Help needed walking in hospital room?: A Little Help needed climbing 3-5 steps with a railing? : A Little 6 Click Score: 20    End of Session Equipment Utilized During Treatment: Gait belt;Oxygen Activity Tolerance: Patient tolerated treatment well;Patient limited by fatigue Patient left: with chair alarm set;with call bell/phone within reach;with family/visitor present Nurse Communication: Mobility status PT Visit Diagnosis: Muscle weakness (generalized) (M62.81);Other abnormalities of gait and mobility (R26.89)    Time: 0300-9233 PT Time Calculation (min) (ACUTE ONLY): 46 min   Charges:   PT Evaluation $PT Re-evaluation: 1 Re-eval PT Treatments $Gait Training: 8-22 mins $Therapeutic Exercise: 8-22 mins   PT G Codes:        Kreg Shropshire, DPT 01/10/2018, 12:26 PM

## 2018-01-10 NOTE — Progress Notes (Signed)
Physical Therapy Treatment Patient Details Name: [name] MRN: [id number] DOB: 1941/08/09 Today's Date: 01/10/2018    History of Present Illness 77 y.o.malewith a known history of COPD, hyperlipidemia, hypertension, peripheral vascular disease, mesenteric ischemia and status post vascular stenting-on Coumadin at home.  s/p elective left knee arthroplasty 1/21. He was transferred to the CCU, retruns to ortho floor with orders to continue PT.    PT Comments    Pt eager to work with PT and showed good effort with all tasks.  He still needed frequent cuing to use walker appropriate and for consistent cadence but he was safe and did not have any LOBs.  Pt was able to negotiate up/down steps w/o direct assist, he should be able to return home tomorrow with HHPT.   Follow Up Recommendations  Home health PT     Equipment Recommendations  Rolling walker with 5" wheels    Recommendations for Other Services       Precautions / Restrictions Precautions Precautions: Knee Precaution Booklet Issued: No Restrictions Weight Bearing Restrictions: Yes LLE Weight Bearing: Weight bearing as tolerated    Mobility  Bed Mobility               General bed mobility comments: pt in recliner, returned to recliner  Transfers Overall transfer level: Modified independent Equipment used: Rolling walker (2 wheeled) Transfers: Sit to/from Stand Sit to Stand: Supervision         General transfer comment: Pt did well getting to standing, minimal cuing for set up and sequencing  Ambulation/Gait Ambulation/Gait assistance: Supervision Ambulation Distance (Feet): 250 Feet Assistive device: Rolling walker (2 wheeled)       General Gait Details: Pt continues to have somewhat forward flexed posture, but needed no physical assist to maintain balance, etc.  As he fatigued he did start veering to the R but ultimately did well and did not have excessive fatigue with  ambulation.   Stairs Stairs: Yes   Stair Management: Two rails;Step to pattern Number of Stairs: 4 General stair comments: Pt did well with steps and CGA.  Also performed single curb negotiation  Wheelchair Mobility    Modified Rankin (Stroke Patients Only)       Balance Overall balance assessment: Modified Independent Sitting-balance support: Feet unsupported;Feet supported;Bilateral upper extremity supported Sitting balance-Leahy Scale: Good     Standing balance support: Bilateral upper extremity supported Standing balance-Leahy Scale: Fair                              Cognition Arousal/Alertness: Awake/alert Behavior During Therapy: WFL for tasks assessed/performed Overall Cognitive Status: Within Functional Limits for tasks assessed                                 General Comments: pt required ocassional VC from family to attend to OT as pt was organizing his toiletries bag      Exercises Total Joint Exercises Ankle Circles/Pumps: AROM;10 reps Quad Sets: Strengthening;15 reps Gluteal Sets: Strengthening;15 reps Short Arc Quad: Strengthening;15 reps Heel Slides: AROM;Strengthening;10 reps Hip ABduction/ADduction: Strengthening;10 reps Straight Leg Raises: AROM;10 reps Long Arc Quad: Strengthening;10 reps Knee Flexion: PROM;5 reps Other Exercises Other Exercises: pt/family educated in compression stocking mgt, polar care mgt, and DME for toileting/bathing to maximize safety/independence    General Comments        Pertinent Vitals/Pain Pain Assessment: No/denies pain Pain  Score: (pt alternates between "No pain" and "Not too bad") Pain Location: L knee Pain Descriptors / Indicators: Aching;Sore;Operative site guarding Pain Intervention(s): Limited activity within patient's tolerance;Monitored during session;Ice applied    Home Living Family/patient expects to be discharged to:: Private residence Living Arrangements:  Alone Available Help at Discharge: Family;Friend(s);Available 24 hours/day Type of Home: House Home Access: Ramped entrance   Home Layout: Two level;Able to live on main level with bedroom/bathroom Home Equipment: Cane - quad;Grab bars - tub/shower      Prior Function Level of Independence: Independent      Comments: Pt Ind with amb community distances, no fall history, Ind with ADLs.    PT Goals (current goals can now be found in the care plan section) Acute Rehab PT Goals Patient Stated Goal: go home Progress towards PT goals: Progressing toward goals    Frequency    BID      PT Plan Current plan remains appropriate    Co-evaluation              AM-PAC PT "6 Clicks" Daily Activity  Outcome Measure  Difficulty turning over in bed (including adjusting bedclothes, sheets and blankets)?: None Difficulty moving from lying on back to sitting on the side of the bed? : A Little Difficulty sitting down on and standing up from a chair with arms (e.g., wheelchair, bedside commode, etc,.)?: A Little Help needed moving to and from a bed to chair (including a wheelchair)?: None Help needed walking in hospital room?: A Little Help needed climbing 3-5 steps with a railing? : A Little 6 Click Score: 20    End of Session Equipment Utilized During Treatment: Gait belt Activity Tolerance: Patient tolerated treatment well;Patient limited by fatigue Patient left: with chair alarm set;with call bell/phone within reach;with family/visitor present   PT Visit Diagnosis: Muscle weakness (generalized) (M62.81);Other abnormalities of gait and mobility (R26.89)     Time: 1400-1441 PT Time Calculation (min) (ACUTE ONLY): 41 min  Charges:  $Gait Training: 8-22 mins $Therapeutic Exercise: 23-37 mins                    G Codes:       Kreg Shropshire, DPT 01/10/2018, 3:50 PM

## 2018-01-30 ENCOUNTER — Telehealth (INDEPENDENT_AMBULATORY_CARE_PROVIDER_SITE_OTHER): Payer: Self-pay

## 2018-01-30 NOTE — Telephone Encounter (Signed)
He needs a mesenteric duplex.

## 2018-01-31 ENCOUNTER — Encounter (INDEPENDENT_AMBULATORY_CARE_PROVIDER_SITE_OTHER): Payer: Self-pay

## 2018-01-31 ENCOUNTER — Encounter (INDEPENDENT_AMBULATORY_CARE_PROVIDER_SITE_OTHER): Payer: Self-pay | Admitting: Vascular Surgery

## 2018-01-31 ENCOUNTER — Ambulatory Visit (INDEPENDENT_AMBULATORY_CARE_PROVIDER_SITE_OTHER): Payer: Medicare PPO

## 2018-01-31 ENCOUNTER — Ambulatory Visit (INDEPENDENT_AMBULATORY_CARE_PROVIDER_SITE_OTHER): Payer: Medicare PPO | Admitting: Vascular Surgery

## 2018-01-31 VITALS — BP 150/79 | HR 81 | Resp 17 | Ht 67.0 in | Wt 149.0 lb

## 2018-01-31 DIAGNOSIS — K559 Vascular disorder of intestine, unspecified: Secondary | ICD-10-CM

## 2018-01-31 DIAGNOSIS — I708 Atherosclerosis of other arteries: Secondary | ICD-10-CM

## 2018-01-31 DIAGNOSIS — E782 Mixed hyperlipidemia: Secondary | ICD-10-CM | POA: Diagnosis not present

## 2018-01-31 NOTE — Progress Notes (Signed)
Subjective:    Patient ID: [name], male    DOB: 10-30-1941, 77 y.o.   MRN: [id number] Chief Complaint  Patient presents with  . Follow-up    Mesenteric   Patient presents sooner than his originally scheduled mesenteric stenosis follow-up.  The patient notes a week of progressively worsening loss of appetite and abdominal pain.  The patient states the symptoms are very similar to what he is experienced in the past when he has required mesenteric interventions.  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  The patient denies any postprandial pain nausea or vomiting.  The patient denies any fever.  The patient underwent a mesenteric duplex which was notable for a patent superior mesenteric artery stent with Doppler velocities suggestive of greater than 70%. When compared to previous velocities noted in January 2019 there has been a slight increase.    Review of Systems  Constitutional: Negative.   HENT: Negative.   Eyes: Negative.   Respiratory: Negative.   Cardiovascular: Negative.   Gastrointestinal: Positive for abdominal pain.  Endocrine: Negative.   Genitourinary: Negative.   Musculoskeletal: Negative.   Skin: Negative.   Allergic/Immunologic: Negative.   Neurological: Negative.   Hematological: Negative.   Psychiatric/Behavioral: Negative.       Objective:   Physical Exam  Constitutional: He is oriented to person, place, and time. He appears well-developed and well-nourished. No distress.  HENT:  Head: Normocephalic and atraumatic.  Eyes: Conjunctivae are normal. Pupils are equal, round, and reactive to light.  Neck: Normal range of motion.  Cardiovascular: Normal rate, regular rhythm, normal heart sounds and intact distal pulses.  Pulses:      Radial pulses are 2+ on the right side, and 2+ on the left side.  Pulmonary/Chest: Effort normal and breath sounds normal. No respiratory distress. He has no wheezes. He has no  rales.  Abdominal: Soft. Bowel sounds are normal. He exhibits no distension. There is no tenderness. There is no rebound.  Musculoskeletal: Normal range of motion.  Neurological: He is alert and oriented to person, place, and time.  Skin: Skin is warm and dry. He is not diaphoretic.  Psychiatric: He has a normal mood and affect. His behavior is normal. Judgment and thought content normal.  Vitals reviewed.  BP (!) 150/79 (BP Location: Right Arm, Patient Position: Sitting)   Pulse 81   Resp 17   Ht 5\' 7"  (1.702 m)   Wt 149 lb (67.6 kg)   BMI 23.34 kg/m   Past Medical History:  Diagnosis Date  . COPD (chronic obstructive pulmonary disease) ([place])   . HLD (hyperlipidemia)   . Hypertension   . PVD (peripheral vascular disease) ([place])   . Vomiting    and abd pain   Social History   Socioeconomic History  . Marital status: Widowed    Spouse name: Not on file  . Number of children: Not on file  . Years of education: Not on file  . Highest education level: Not on file  Social Needs  . Financial resource strain: Not on file  . Food insecurity - worry: Not on file  . Food insecurity - inability: Not on file  . Transportation needs - medical: Not on file  . Transportation needs - non-medical: Not on file  Occupational History  . Not on file  Tobacco Use  . Smoking status: Former Smoker    Types: E-cigarettes    Last attempt to quit:  2014    Years since quitting: 5.1  . Smokeless tobacco: Never Used  Substance and Sexual Activity  . Alcohol use: Yes    Alcohol/week: 1.2 - 2.4 oz    Types: 2 - 4 Cans of beer per week  . Drug use: No  . Sexual activity: Not on file  Other Topics Concern  . Not on file  Social History Narrative  . Not on file   Past Surgical History:  Procedure Laterality Date  . KNEE ARTHROPLASTY Left 01/07/2018   Procedure: COMPUTER ASSISTED TOTAL KNEE ARTHROPLASTY;  Surgeon: Dereck Leep, MD;  Location: [facility];  Service: Orthopedics;  Laterality:  Left;  . PERIPHERAL VASCULAR CATHETERIZATION N/A 06/15/2015   Procedure: Abdominal Aortogram w/Lower Extremity;  Surgeon: Katha Cabal, MD;  Location: [facility];  Service: Cardiovascular;  Laterality: N/A;  . PERIPHERAL VASCULAR CATHETERIZATION  06/15/2015   Procedure: Lower Extremity Intervention;  Surgeon: Katha Cabal, MD;  Location: [facility];  Service: Cardiovascular;;  . PERIPHERAL VASCULAR CATHETERIZATION N/A 08/25/2016   Procedure: Abdominal Aortogram;  Surgeon: Algernon Huxley, MD;  Location: [facility];  Service: Cardiovascular;  Laterality: N/A;  . PERIPHERAL VASCULAR CATHETERIZATION N/A 08/25/2016   Procedure: Visceral Artery Intervention;  Surgeon: Algernon Huxley, MD;  Location: [facility];  Service: Cardiovascular;  Laterality: N/A;  . PERIPHERAL VASCULAR CATHETERIZATION N/A 11/13/2016   Procedure: Visceral Angiography;  Surgeon: Algernon Huxley, MD;  Location: [place] [facility];  Service: Cardiovascular;  Laterality: N/A;  . PERIPHERAL VASCULAR CATHETERIZATION N/A 11/15/2016   Procedure: Aortic Intervention;  Surgeon: Katha Cabal, MD;  Location: [facility];  Service: Cardiovascular;  Laterality: N/A;  . PERIPHERAL VASCULAR CATHETERIZATION N/A 11/15/2016   Procedure: Visceral Angiography;  Surgeon: Katha Cabal, MD;  Location: [facility];  Service: Cardiovascular;  Laterality: N/A;  . PERIPHERAL VASCULAR CATHETERIZATION N/A 11/15/2016   Procedure: Visceral Artery Intervention;  Surgeon: Katha Cabal, MD;  Location: [facility];  Service: Cardiovascular;  Laterality: N/A;  . TONSILLECTOMY     Family History  Problem Relation Age of Onset  . Heart attack Mother   . Heart attack Father   . Basal cell carcinoma Father    Allergies  Allergen Reactions  . Eliquis [Apixaban] Other (See Comments)    causes nose bleeds  . Oxycodone-Acetaminophen Other (See Comments)    hallucinations  . Penicillins  Rash    Childhood allergy Has patient had a PCN reaction causing immediate rash, facial/tongue/throat swelling, SOB or lightheadedness with hypotension: No Has patient had a PCN reaction causing severe rash involving mucus membranes or skin necrosis: No Has patient had a PCN reaction that required hospitalization: No Has patient had a PCN reaction occurring within the last 10 years: No If all of the above answers are "NO", then may proceed with Cephalosporin use.       Assessment & Plan:  Patient presents sooner than his originally scheduled mesenteric stenosis follow-up.  The patient notes a week of progressively worsening loss of appetite and abdominal pain.  The patient states the symptoms are very similar to what he is experienced in the past when he has required mesenteric interventions.  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  The patient denies any postprandial pain nausea or vomiting.  The patient denies any fever.  The patient underwent a mesenteric duplex which was notable  for a patent superior mesenteric artery stent with Doppler velocities suggestive of greater than 70%. When compared to previous velocities noted in January 2019 there has been a slight increase.   1. Occlusion of celiac artery - Stable This is a stable finding.  2. Mesenteric ischemia (HCC) - Stable The patient presents with a week of worsening loss of appetite and abdominal pain Patient has a history of mesenteric artery ischemia Due to the patient's history and worsening symptoms recommend a mesenteric angiogram to assess the patient's anatomy and degree of stenosis Procedure, risks and benefits discussed with patient All questions answered The patient wishes to proceed  3. Mixed hyperlipidemia - Stable Encouraged good control as its slows the progression of atherosclerotic disease  Current Outpatient Medications on File Prior to Visit  Medication Sig  Dispense Refill  . atenolol (TENORMIN) 25 MG tablet Take 25 mg by mouth daily.    Marland Kitchen atorvastatin (LIPITOR) 40 MG tablet Take 20 mg by mouth daily. Takes 0.5 tablet    . diltiazem (CARDIZEM) 120 MG tablet Take 120 mg by mouth daily.     . hydrocortisone 2.5 % cream Apply topically.    Marland Kitchen lisinopril (PRINIVIL,ZESTRIL) 20 MG tablet Take 20 mg by mouth daily. In am.    . traMADol (ULTRAM) 50 MG tablet Take 1-2 tablets (50-100 mg total) by mouth every 4 (four) hours as needed for moderate pain. 30 tablet 0  . warfarin (COUMADIN) 2 MG tablet Take by mouth.     No current facility-administered medications on file prior to visit.    There are no Patient Instructions on file for this visit. No Follow-up on file.  Kasumi Ditullio A Emory Gallentine, PA-C

## 2018-01-31 NOTE — Telephone Encounter (Signed)
Patient came in the office to have mesenteric ultrasound and was seen by provider on today

## 2018-02-04 NOTE — Telephone Encounter (Signed)
Patient called with the question of taking his blood pressure meds before his procedure on 02/12/18. I explained that he can take all meds with small sips of water unless it was Metformin, blood thinner but he can take an Aspirin.

## 2018-02-11 ENCOUNTER — Other Ambulatory Visit (INDEPENDENT_AMBULATORY_CARE_PROVIDER_SITE_OTHER): Payer: Self-pay | Admitting: Vascular Surgery

## 2018-02-12 ENCOUNTER — Ambulatory Visit: Payer: Medicare PPO | Admitting: Registered Nurse

## 2018-02-12 ENCOUNTER — Ambulatory Visit
Admission: RE | Admit: 2018-02-12 | Discharge: 2018-02-12 | Disposition: A | Discharge status: Home / Self Care | Payer: Medicare PPO | Source: Ambulatory Visit | Attending: Vascular Surgery | Admitting: Vascular Surgery

## 2018-02-12 ENCOUNTER — Encounter
Admission: RE | Disposition: A | Discharge status: Home / Self Care | Payer: Self-pay | Source: Ambulatory Visit | Attending: Vascular Surgery

## 2018-02-12 DIAGNOSIS — Z7902 Long term (current) use of antithrombotics/antiplatelets: Secondary | ICD-10-CM | POA: Diagnosis not present

## 2018-02-12 DIAGNOSIS — K551 Chronic vascular disorders of intestine: Secondary | ICD-10-CM

## 2018-02-12 DIAGNOSIS — E782 Mixed hyperlipidemia: Secondary | ICD-10-CM | POA: Diagnosis not present

## 2018-02-12 DIAGNOSIS — K559 Vascular disorder of intestine, unspecified: Secondary | ICD-10-CM | POA: Insufficient documentation

## 2018-02-12 DIAGNOSIS — I708 Atherosclerosis of other arteries: Secondary | ICD-10-CM | POA: Insufficient documentation

## 2018-02-12 DIAGNOSIS — I10 Essential (primary) hypertension: Secondary | ICD-10-CM | POA: Insufficient documentation

## 2018-02-12 DIAGNOSIS — J449 Chronic obstructive pulmonary disease, unspecified: Secondary | ICD-10-CM | POA: Diagnosis not present

## 2018-02-12 DIAGNOSIS — Z880 Allergy status to penicillin: Secondary | ICD-10-CM | POA: Diagnosis not present

## 2018-02-12 DIAGNOSIS — Z9889 Other specified postprocedural states: Secondary | ICD-10-CM | POA: Diagnosis not present

## 2018-02-12 DIAGNOSIS — Z885 Allergy status to narcotic agent status: Secondary | ICD-10-CM | POA: Diagnosis not present

## 2018-02-12 DIAGNOSIS — Z79899 Other long term (current) drug therapy: Secondary | ICD-10-CM | POA: Diagnosis not present

## 2018-02-12 DIAGNOSIS — Z8249 Family history of ischemic heart disease and other diseases of the circulatory system: Secondary | ICD-10-CM | POA: Insufficient documentation

## 2018-02-12 DIAGNOSIS — Z87891 Personal history of nicotine dependence: Secondary | ICD-10-CM | POA: Diagnosis not present

## 2018-02-12 DIAGNOSIS — Z888 Allergy status to other drugs, medicaments and biological substances status: Secondary | ICD-10-CM | POA: Diagnosis not present

## 2018-02-12 DIAGNOSIS — Z808 Family history of malignant neoplasm of other organs or systems: Secondary | ICD-10-CM | POA: Insufficient documentation

## 2018-02-12 HISTORY — PX: VISCERAL ANGIOGRAPHY: CATH118276

## 2018-02-12 LAB — BUN: BUN: 11 mg/dL (ref 6–20)

## 2018-02-12 LAB — CREATININE, SERUM: Creatinine, Ser: 0.75 mg/dL (ref 0.61–1.24)

## 2018-02-12 LAB — PROTIME-INR
INR: 2.26
Prothrombin Time: 24.8 seconds — ABNORMAL HIGH (ref 11.4–15.2)

## 2018-02-12 SURGERY — VISCERAL ANGIOGRAPHY
Anesthesia: General

## 2018-02-12 MED ORDER — FENTANYL CITRATE (PF) 100 MCG/2ML IJ SOLN
INTRAMUSCULAR | Status: AC
  Filled 2018-02-12: qty 2

## 2018-02-12 MED ORDER — LIDOCAINE HCL (PF) 2 % IJ SOLN
INTRAMUSCULAR | Status: AC
  Filled 2018-02-12: qty 10

## 2018-02-12 MED ORDER — FENTANYL CITRATE (PF) 100 MCG/2ML IJ SOLN
25.0000 ug | INTRAMUSCULAR | Status: DC | PRN
  Administered 2018-02-12 (×2): 50 ug via INTRAVENOUS

## 2018-02-12 MED ORDER — METHYLPREDNISOLONE SODIUM SUCC 125 MG IJ SOLR: 125.0000 mg | INTRAMUSCULAR | Status: DC | PRN

## 2018-02-12 MED ORDER — SUCCINYLCHOLINE CHLORIDE 20 MG/ML IJ SOLN
INTRAMUSCULAR | Status: AC
  Filled 2018-02-12: qty 1

## 2018-02-12 MED ORDER — SODIUM CHLORIDE 0.9 % IV SOLN
INTRAVENOUS | Status: DC
  Administered 2018-02-12: 1000 mL via INTRAVENOUS

## 2018-02-12 MED ORDER — ONDANSETRON HCL 4 MG/2ML IJ SOLN: 4.0000 mg | Freq: Four times a day (QID) | INTRAMUSCULAR | Status: DC | PRN

## 2018-02-12 MED ORDER — CLOPIDOGREL BISULFATE 75 MG PO TABS: 150.0000 mg | ORAL_TABLET | Freq: Once | ORAL | Status: DC

## 2018-02-12 MED ORDER — SODIUM CHLORIDE 0.9% FLUSH: 3.0000 mL | INTRAVENOUS | Status: DC | PRN

## 2018-02-12 MED ORDER — ONDANSETRON HCL 4 MG/2ML IJ SOLN
INTRAMUSCULAR | Status: DC | PRN
  Administered 2018-02-12: 4 mg via INTRAVENOUS

## 2018-02-12 MED ORDER — FENTANYL CITRATE (PF) 100 MCG/2ML IJ SOLN
INTRAMUSCULAR | Status: DC | PRN
  Administered 2018-02-12 (×2): 25 ug via INTRAVENOUS
  Administered 2018-02-12: 100 ug via INTRAVENOUS
  Administered 2018-02-12: 50 ug via INTRAVENOUS

## 2018-02-12 MED ORDER — DEXMEDETOMIDINE HCL 200 MCG/2ML IV SOLN
INTRAVENOUS | Status: DC | PRN
  Administered 2018-02-12: 12 ug via INTRAVENOUS

## 2018-02-12 MED ORDER — HEPARIN (PORCINE) IN NACL 2-0.9 UNIT/ML-% IJ SOLN
INTRAMUSCULAR | Status: AC
  Filled 2018-02-12: qty 1000

## 2018-02-12 MED ORDER — ONDANSETRON HCL 4 MG/2ML IJ SOLN
INTRAMUSCULAR | Status: AC
  Filled 2018-02-12: qty 2

## 2018-02-12 MED ORDER — DEXAMETHASONE SODIUM PHOSPHATE 10 MG/ML IJ SOLN
INTRAMUSCULAR | Status: AC
  Filled 2018-02-12: qty 1

## 2018-02-12 MED ORDER — PROPOFOL 10 MG/ML IV BOLUS
INTRAVENOUS | Status: AC
  Filled 2018-02-12: qty 20

## 2018-02-12 MED ORDER — CLINDAMYCIN PHOSPHATE 300 MG/50ML IV SOLN
300.0000 mg | Freq: Once | INTRAVENOUS | Status: AC
  Administered 2018-02-12: 300 mg via INTRAVENOUS

## 2018-02-12 MED ORDER — IOPAMIDOL (ISOVUE-300) INJECTION 61%
INTRAVENOUS | Status: DC | PRN
  Administered 2018-02-12: 30 mL via INTRAVENOUS

## 2018-02-12 MED ORDER — MIDAZOLAM HCL 2 MG/2ML IJ SOLN
INTRAMUSCULAR | Status: AC
  Filled 2018-02-12: qty 2

## 2018-02-12 MED ORDER — PHENYLEPHRINE HCL 10 MG/ML IJ SOLN
INTRAMUSCULAR | Status: DC | PRN
  Administered 2018-02-12: 200 ug via INTRAVENOUS
  Administered 2018-02-12 (×4): 100 ug via INTRAVENOUS
  Administered 2018-02-12: 200 ug via INTRAVENOUS

## 2018-02-12 MED ORDER — DEXMEDETOMIDINE HCL IN NACL 200 MCG/50ML IV SOLN
INTRAVENOUS | Status: AC
  Filled 2018-02-12: qty 50

## 2018-02-12 MED ORDER — PROPOFOL 10 MG/ML IV BOLUS
INTRAVENOUS | Status: DC | PRN
  Administered 2018-02-12: 150 mg via INTRAVENOUS
  Administered 2018-02-12: 100 mg via INTRAVENOUS
  Administered 2018-02-12: 50 mg via INTRAVENOUS

## 2018-02-12 MED ORDER — SODIUM CHLORIDE 0.9 % IV SOLN: INTRAVENOUS | Status: DC

## 2018-02-12 MED ORDER — MENTHOL 3 MG MT LOZG
1.0000 | LOZENGE | OROMUCOSAL | Status: DC | PRN
  Administered 2018-02-12: 3 mg via ORAL
  Filled 2018-02-12 (×2): qty 9

## 2018-02-12 MED ORDER — LABETALOL HCL 5 MG/ML IV SOLN: 10.0000 mg | INTRAVENOUS | Status: DC | PRN

## 2018-02-12 MED ORDER — SUCCINYLCHOLINE CHLORIDE 20 MG/ML IJ SOLN
INTRAMUSCULAR | Status: DC | PRN
  Administered 2018-02-12: 100 mg via INTRAVENOUS

## 2018-02-12 MED ORDER — ALBUTEROL SULFATE HFA 108 (90 BASE) MCG/ACT IN AERS
INHALATION_SPRAY | RESPIRATORY_TRACT | Status: DC | PRN
  Administered 2018-02-12: 8 via RESPIRATORY_TRACT

## 2018-02-12 MED ORDER — ONDANSETRON HCL 4 MG/2ML IJ SOLN: 4.0000 mg | Freq: Once | INTRAMUSCULAR | Status: DC | PRN

## 2018-02-12 MED ORDER — EPHEDRINE SULFATE 50 MG/ML IJ SOLN
INTRAMUSCULAR | Status: AC
  Filled 2018-02-12: qty 1

## 2018-02-12 MED ORDER — LIDOCAINE HCL (PF) 1 % IJ SOLN
INTRAMUSCULAR | Status: AC
  Filled 2018-02-12: qty 30

## 2018-02-12 MED ORDER — FENTANYL CITRATE (PF) 100 MCG/2ML IJ SOLN
INTRAMUSCULAR | Status: AC
Start: 2018-02-12 — End: 2018-02-12
  Administered 2018-02-12: 50 ug via INTRAVENOUS
  Filled 2018-02-12: qty 2

## 2018-02-12 MED ORDER — ROCURONIUM BROMIDE 50 MG/5ML IV SOLN
INTRAVENOUS | Status: AC
  Filled 2018-02-12: qty 1

## 2018-02-12 MED ORDER — CLINDAMYCIN PHOSPHATE 300 MG/50ML IV SOLN
INTRAVENOUS | Status: AC
  Filled 2018-02-12: qty 50

## 2018-02-12 MED ORDER — MORPHINE SULFATE (PF) 4 MG/ML IV SOLN: 2.0000 mg | INTRAVENOUS | Status: DC | PRN

## 2018-02-12 MED ORDER — HYDRALAZINE HCL 20 MG/ML IJ SOLN: 5.0000 mg | INTRAMUSCULAR | Status: DC | PRN

## 2018-02-12 MED ORDER — HYDROMORPHONE HCL 1 MG/ML IJ SOLN: 1.0000 mg | Freq: Once | INTRAMUSCULAR | Status: DC | PRN

## 2018-02-12 MED ORDER — FAMOTIDINE 20 MG PO TABS: 40.0000 mg | ORAL_TABLET | ORAL | Status: DC | PRN

## 2018-02-12 MED ORDER — EPHEDRINE SULFATE 50 MG/ML IJ SOLN
INTRAMUSCULAR | Status: DC | PRN
  Administered 2018-02-12: 10 mg via INTRAVENOUS

## 2018-02-12 MED ORDER — HEPARIN SODIUM (PORCINE) 1000 UNIT/ML IJ SOLN
INTRAMUSCULAR | Status: AC
  Filled 2018-02-12: qty 1

## 2018-02-12 MED ORDER — DEXAMETHASONE SODIUM PHOSPHATE 10 MG/ML IJ SOLN
INTRAMUSCULAR | Status: DC | PRN
  Administered 2018-02-12: 10 mg via INTRAVENOUS

## 2018-02-12 MED ORDER — HEPARIN SODIUM (PORCINE) 1000 UNIT/ML IJ SOLN
INTRAMUSCULAR | Status: DC | PRN
  Administered 2018-02-12: 3000 [IU] via INTRAVENOUS

## 2018-02-12 MED ORDER — SODIUM CHLORIDE 0.9 % IV SOLN: 250.0000 mL | INTRAVENOUS | Status: DC | PRN

## 2018-02-12 MED ORDER — MIDAZOLAM HCL 2 MG/2ML IJ SOLN
INTRAMUSCULAR | Status: DC | PRN
  Administered 2018-02-12 (×2): 1 mg via INTRAVENOUS

## 2018-02-12 MED ORDER — SODIUM CHLORIDE 0.9% FLUSH: 3.0000 mL | Freq: Two times a day (BID) | INTRAVENOUS | Status: DC

## 2018-02-12 SURGICAL SUPPLY — 26 items
BALLN LUTONIX DCB 6X40X130 (BALLOONS) ×3
BALLN LUTONIX DCB 6X60X130 (BALLOONS) ×3
BALLOON LUTONIX DCB 6X40X130 (BALLOONS) ×1 IMPLANT
BALLOON LUTONIX DCB 6X60X130 (BALLOONS) ×1 IMPLANT
CATH ANGIO 5F 100CM .035 PIG (CATHETERS) ×3 IMPLANT
CATH BEACON 5 .038 100 VERT TP (CATHETERS) ×3 IMPLANT
CATH PIG 70CM (CATHETERS) ×3 IMPLANT
COVER DRAPE FLUORO 36X44 (DRAPES) ×9 IMPLANT
DEVICE PRESTO INFLATION (MISCELLANEOUS) ×3 IMPLANT
DEVICE RAD TR BAND REGULAR (VASCULAR PRODUCTS) ×3 IMPLANT
DEVICE TORQUE (MISCELLANEOUS) ×3 IMPLANT
DRAPE BRACHIAL (DRAPES) ×6 IMPLANT
GUIDEWIRE LT ZIPWIRE 035X260 (WIRE) ×3 IMPLANT
NEEDLE ENTRY 21GA 7CM ECHOTIP (NEEDLE) ×3 IMPLANT
PACK ANGIOGRAPHY (CUSTOM PROCEDURE TRAY) ×3 IMPLANT
SET INTRO CAPELLA COAXIAL (SET/KITS/TRAYS/PACK) ×3 IMPLANT
SHEATH BRITE TIP 5FRX11 (SHEATH) ×6 IMPLANT
SHEATH BRITE TIP 6FRX5.5 (SHEATH) ×3 IMPLANT
SHEATH SHUTTLE SELECT 6F (SHEATH) ×3 IMPLANT
SHIELD X-DRAPE GOLD 12X17 (MISCELLANEOUS) ×6 IMPLANT
SYR MEDRAD MARK V 150ML (SYRINGE) ×3 IMPLANT
TOWEL OR 17X26 4PK STRL BLUE (TOWEL DISPOSABLE) ×6 IMPLANT
TUBING CONTRAST HIGH PRESS 72 (TUBING) ×9 IMPLANT
VALVE CHECKFLO PERFORMER (SHEATH) ×3 IMPLANT
WIRE J 3MM .035X145CM (WIRE) ×3 IMPLANT
WIRE MAGIC TORQUE 260C (WIRE) ×3 IMPLANT

## 2018-02-12 NOTE — H&P (Signed)
[facility] History & Physical Update  The patient was interviewed and re-examined.  The patient's previous History and Physical has been reviewed and is unchanged.  There is no change in the plan of care. We plan to proceed with the scheduled procedure.  Hortencia Pilar, MD  02/12/2018, 8:35 AM

## 2018-02-12 NOTE — Transfer of Care (Signed)
Immediate Anesthesia Transfer of Care Note  Patient: [name]  Procedure(s) Performed: VISCERAL ANGIOGRAPHY (N/A )  Patient Location: PACU  Anesthesia Type:General  Level of Consciousness: drowsy and patient cooperative  Airway & Oxygen Therapy: Patient Spontanous Breathing and Patient connected to face mask oxygen  Post-op Assessment: Report given to RN and Post -op Vital signs reviewed and stable  Post vital signs: Reviewed and stable  Last Vitals:  Vitals:   02/12/18 0732 02/12/18 1035  BP: (!) 152/76 123/66  Pulse: 76 72  Resp: 18 20  Temp: 36.8 C (!) 36.2 C  SpO2: 95% 100%    Last Pain:  Vitals:   02/12/18 1035  TempSrc:   PainSc: Asleep         Complications: No apparent anesthesia complications

## 2018-02-12 NOTE — Anesthesia Preprocedure Evaluation (Signed)
Anesthesia Evaluation  Patient identified by MRN, date of birth, ID band Patient awake    Reviewed: Allergy & Precautions, NPO status , Patient's Chart, lab work & pertinent test results  History of Anesthesia Complications Negative for: history of anesthetic complications  Airway Mallampati: II       Dental   Pulmonary neg sleep apnea, COPD,  COPD inhaler, former smoker,           Cardiovascular hypertension, Pt. on medications + Peripheral Vascular Disease  (-) CHF (-) dysrhythmias (-) Valvular Problems/Murmurs     Neuro/Psych neg Seizures    GI/Hepatic Neg liver ROS, neg GERD  ,  Endo/Other  neg diabetes  Renal/GU negative Renal ROS     Musculoskeletal   Abdominal   Peds  Hematology  (+) anemia ,   Anesthesia Other Findings Past Medical History: No date: COPD (chronic obstructive pulmonary disease) ([place]) No date: HLD (hyperlipidemia) No date: Hypertension No date: PVD (peripheral vascular disease) (HCC) No date: Vomiting     Comment:  and abd pain   Reproductive/Obstetrics                             Anesthesia Physical  Anesthesia Plan  ASA: II  Anesthesia Plan: General   Post-op Pain Management:    Induction: Intravenous  PONV Risk Score and Plan: 2 and Ondansetron and Dexamethasone  Airway Management Planned: LMA  Additional Equipment:   Intra-op Plan:   Post-operative Plan: Extubation in OR  Informed Consent: I have reviewed the patients History and Physical, chart, labs and discussed the procedure including the risks, benefits and alternatives for the proposed anesthesia with the patient or authorized representative who has indicated his/her understanding and acceptance.     Plan Discussed with:   Anesthesia Plan Comments:         Anesthesia Quick Evaluation

## 2018-02-12 NOTE — Anesthesia Procedure Notes (Signed)
Procedure Name: LMA Insertion Date/Time: 02/12/2018 8:54 AM Performed by: Jonna Clark, CRNA Pre-anesthesia Checklist: Patient identified, Patient being monitored, Timeout performed, Emergency Drugs available and Suction available Patient Re-evaluated:Patient Re-evaluated prior to induction Oxygen Delivery Method: Circle system utilized Preoxygenation: Pre-oxygenation with 100% oxygen Induction Type: IV induction Ventilation: Mask ventilation without difficulty LMA: LMA inserted LMA Size: 5.0 Tube type: Oral Number of attempts: 1 Placement Confirmation: positive ETCO2 and breath sounds checked- equal and bilateral Tube secured with: Tape Dental Injury: Teeth and Oropharynx as per pre-operative assessment

## 2018-02-12 NOTE — Op Note (Signed)
[facility] Percutaneous Study/Intervention Procedural Note   Date of Surgery: 02/12/2018  Surgeon(s): Hortencia Pilar   Assistants:None  Pre-operative Diagnosis: Mesenteric ischemia with worsening abdominal pain; complication vascular device with restenosis of existing superior mesenteric artery stent  Post-operative diagnosis: Same  Procedure(s) Performed: 1. Ultrasound guidance for vascular access left radial artery 2. Catheter placement into SMA from left radial approach 3. Aortogram and selective SMA angiogram 4. Percutaneous transluminal angioplasty of the superior mesenteric artery to 6 mm with a Lutonix drug-eluting balloon 5. TL R band applied to left radial artery  Anesthesia: General by endotracheal intubation  Fluoro Time: 6.4 minutes  Contrast: 30 cc  Indications: Patient is a 77 year old gentleman with a known history of mesenteric ischemia he is status post multiple interventions in the past.  He recently presented to the office for routine follow-up complaining of increased abdominal pain and postprandial symptoms associated with some weight loss.  Noninvasive studies demonstrated stricture within the stent, angiogram is performed to evaluate the lesion more thoroughly and potentially allow treatment. Risks and benefits were discussed and informed consent was obtained  Procedure: The patient was identified and appropriate procedural time out was performed. The patient was then placed supine on the table and prepped and draped in the usual sterile fashion.Moderate conscious sedation was administered during a face to face encounter with the patient with the RN monitoring their vital signs, mental status, telemetry and pulse oximetry throughout the procedure.    Ultrasound was used to evaluate the left radial artery. It was patent . A digital ultrasound image was  acquired. A micropuncture needle was used to access the left radial artery under direct ultrasound guidance and a permanent image was acquired. A 0.035 J wire was advanced without resistance and a 6Fr sheath was placed.  Magic torque wire was then advanced under fluoroscopic guidance into the aortic arch followed by a Kumpe catheter.  Magic torque wire was exchanged for a Glidewire and the wire and catheter were negotiated into the descending aorta.  Previously placed stent was identified under fluoroscopy and the pigtail catheter was exchanged for the Kumpe and positioned several centimeters proximal to the stent.  Pigtail catheter was then placed into the aorta and initially an AP aortogram was performed which showed flow in the SMA with delayed filling of the celiac. Lateral projection view was performed which showed stenosis in the SMA at the very distal margin of the previously placed stent.    I then selective cannulated the SMA with a Glidewire and Kumpe catheter. Selective imaging of the superior mesenteric artery demonstrated stenosis at the distal margin of the stent distally. The patient was then given 3000 units of intravenous heparin and I exchanged for a 6 [place] shuttle sheath. I was able to navigate across the lesion without difficulty with a Glidewire and then exchanged for a Magic torque wire.   I then selected a 6 mm diameter by 60 mm length Lutonix drug-eluting balloon and subsequently a 6 mm x 40 mm Lutonix drug-eluting balloon was used within the SMA.  Inflations were to 8-10 atm for several minutes.  Less than 5% residual stenosis was seen on completion angiogram and I elected to terminate the procedure.   The sheath was removed and a TL R band was deployed across the wrist in the usual fashion with excellent hemostatic result.  Findings:  Aortogram:Severe and diffuse atherosclerotic changes are noted.  A very large bulky lesion is identified at the origin of the  celiac which is occluding the celiac proper SMA:The SMA is patent previously placed stent at the origin still has flow but there is moderate to severe in-stent restenosis with a greater than 70% stenosis at the distal margin of the stent.  SMA is then patent it reconstitutes the celiac through collaterals.  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  Following angioplasty there is now near total resolution of the stricture and the in-stent restenosis.  There is now improved flow into the SMA noted on hand-injection.     Disposition: Patient was taken to the recovery room in stable condition having tolerated the procedure well.   Hortencia Pilar 02/12/2018 10:25 AM   This note was created with Dragon Medical transcription system. Any errors in dictation are purely unintentional.

## 2018-02-12 NOTE — Anesthesia Postprocedure Evaluation (Signed)
Anesthesia Post Note  Patient: [name]  Procedure(s) Performed: VISCERAL ANGIOGRAPHY (N/A )  Patient location during evaluation: PACU Anesthesia Type: General Level of consciousness: awake and alert Pain management: pain level controlled Vital Signs Assessment: post-procedure vital signs reviewed and stable Respiratory status: spontaneous breathing, nonlabored ventilation, respiratory function stable and patient connected to nasal cannula oxygen Cardiovascular status: blood pressure returned to baseline and stable Postop Assessment: no apparent nausea or vomiting Anesthetic complications: no     Last Vitals:  Vitals:   02/12/18 1215 02/12/18 1230  BP: 130/78 (!) 148/75  Pulse: 65 68  Resp: 14 10  Temp:    SpO2: 91% 94%    Last Pain:  Vitals:   02/12/18 1114  TempSrc:   PainSc: 3                  Martha Clan

## 2018-02-12 NOTE — Anesthesia Post-op Follow-up Note (Signed)
Anesthesia QCDR form completed.        

## 2018-02-12 NOTE — Anesthesia Procedure Notes (Signed)
Procedure Name: Intubation Date/Time: 02/12/2018 9:46 AM Performed by: Jonna Clark, CRNA Pre-anesthesia Checklist: Patient identified, Patient being monitored, Timeout performed, Emergency Drugs available and Suction available Patient Re-evaluated:Patient Re-evaluated prior to induction Oxygen Delivery Method: Circle system utilized Preoxygenation: Pre-oxygenation with 100% oxygen Induction Type: IV induction Ventilation: Mask ventilation without difficulty Laryngoscope Size: Mac and 3 Grade View: Grade I Tube type: Oral Tube size: 7.0 mm Number of attempts: 1 Airway Equipment and Method: Stylet Placement Confirmation: ETT inserted through vocal cords under direct vision,  positive ETCO2 and breath sounds checked- equal and bilateral Secured at: 21 cm Tube secured with: Tape Dental Injury: Teeth and Oropharynx as per pre-operative assessment

## 2018-03-08 ENCOUNTER — Other Ambulatory Visit (INDEPENDENT_AMBULATORY_CARE_PROVIDER_SITE_OTHER): Payer: Self-pay | Admitting: Vascular Surgery

## 2018-03-08 DIAGNOSIS — K559 Vascular disorder of intestine, unspecified: Secondary | ICD-10-CM

## 2018-03-11 ENCOUNTER — Ambulatory Visit (INDEPENDENT_AMBULATORY_CARE_PROVIDER_SITE_OTHER): Payer: Medicare PPO

## 2018-03-11 ENCOUNTER — Encounter (INDEPENDENT_AMBULATORY_CARE_PROVIDER_SITE_OTHER): Payer: Self-pay | Admitting: Vascular Surgery

## 2018-03-11 ENCOUNTER — Ambulatory Visit (INDEPENDENT_AMBULATORY_CARE_PROVIDER_SITE_OTHER): Payer: Medicare PPO | Admitting: Vascular Surgery

## 2018-03-11 VITALS — BP 169/76 | HR 75 | Resp 16 | Ht 67.0 in | Wt 151.0 lb

## 2018-03-11 DIAGNOSIS — I4892 Unspecified atrial flutter: Secondary | ICD-10-CM

## 2018-03-11 DIAGNOSIS — I10 Essential (primary) hypertension: Secondary | ICD-10-CM | POA: Diagnosis not present

## 2018-03-11 DIAGNOSIS — K559 Vascular disorder of intestine, unspecified: Secondary | ICD-10-CM | POA: Diagnosis not present

## 2018-03-11 DIAGNOSIS — E782 Mixed hyperlipidemia: Secondary | ICD-10-CM

## 2018-03-11 DIAGNOSIS — I739 Peripheral vascular disease, unspecified: Secondary | ICD-10-CM | POA: Diagnosis not present

## 2018-03-11 NOTE — Progress Notes (Signed)
MRN : [id number]  [name] is a 77 y.o. (07-08-1941) male who presents with chief complaint of  Chief Complaint  Patient presents with  . Follow-up    [facility] 3wk Mesentric angio   .  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    Procedure(s) Performed 02/12/2018: 1. Ultrasound guidance for vascular access left radial artery 2. Catheter placement into SMA from left radial approach 3. Aortogram and selective SMA angiogram 4. Percutaneous transluminal angioplasty of the superior mesenteric artery to 6 mm with a Lutonix drug-eluting balloon 5. TL R band applied to left radial artery  The patient denies abdominal pain or postprandial symptoms.  The patient denies weight loss as well as nausea.  The patient does not substantiate food fear, particular foods do not seem to aggravate or alleviate the symptoms.  The patient denies bloody bowel movements or diarrhea.  The patient has a history of colonoscopy which was not diagnostic.  No history of peptic ulcer disease.   The patient denies amaurosis fugax or recent TIA symptoms. There are no recent neurological changes noted. The patient denies claudication symptoms or rest pain symptoms. The patient denies history of DVT, PE or superficial thrombophlebitis. The patient denies recent episodes of angina     Current Meds  Medication Sig  . atenolol (TENORMIN) 25 MG tablet Take 25 mg by mouth daily.  Marland Kitchen atorvastatin (LIPITOR) 40 MG tablet Take 20 mg by mouth daily.   Marland Kitchen diltiazem (DILACOR XR) 120 MG 24 hr capsule Take 120 mg by mouth daily.  . hydrocortisone 2.5 % cream Apply 1 application topically 2 (two) times daily as needed (for itching).   Marland Kitchen lisinopril (PRINIVIL,ZESTRIL) 20 MG tablet Take 20 mg by mouth daily.     Past  Medical History:  Diagnosis Date  . COPD (chronic obstructive pulmonary disease) ([place])   . HLD (hyperlipidemia)   . Hypertension   . PVD (peripheral vascular disease) ([place])   . Vomiting    and abd pain    Past Surgical History:  Procedure Laterality Date  . KNEE ARTHROPLASTY Left 01/07/2018   Procedure: COMPUTER ASSISTED TOTAL KNEE ARTHROPLASTY;  Surgeon: Dereck Leep, MD;  Location: [facility];  Service: Orthopedics;  Laterality: Left;  . PERIPHERAL VASCULAR CATHETERIZATION N/A 06/15/2015   Procedure: Abdominal Aortogram w/Lower Extremity;  Surgeon: Katha Cabal, MD;  Location: [place] [facility];  Service: Cardiovascular;  Laterality: N/A;  . PERIPHERAL VASCULAR CATHETERIZATION  06/15/2015   Procedure: Lower Extremity Intervention;  Surgeon: Katha Cabal, MD;  Location: [place] [facility];  Service: Cardiovascular;;  . PERIPHERAL VASCULAR CATHETERIZATION N/A 08/25/2016   Procedure: Abdominal Aortogram;  Surgeon: Algernon Huxley, MD;  Location: [place] [facility];  Service: Cardiovascular;  Laterality: N/A;  . PERIPHERAL VASCULAR CATHETERIZATION N/A 08/25/2016   Procedure: Visceral Artery Intervention;  Surgeon: Algernon Huxley, MD;  Location: [place] [facility];  Service: Cardiovascular;  Laterality: N/A;  . PERIPHERAL VASCULAR CATHETERIZATION N/A 11/13/2016   Procedure: Visceral Angiography;  Surgeon: Algernon Huxley, MD;  Location: [place] [facility];  Service: Cardiovascular;  Laterality: N/A;  . PERIPHERAL VASCULAR CATHETERIZATION N/A 11/15/2016   Procedure: Aortic Intervention;  Surgeon: Katha Cabal, MD;  Location: [place] [facility];  Service: Cardiovascular;  Laterality: N/A;  . PERIPHERAL VASCULAR CATHETERIZATION N/A 11/15/2016   Procedure: Visceral  Angiography;  Surgeon: Katha Cabal, MD;  Location: [place] [facility];  Service: Cardiovascular;  Laterality: N/A;  . PERIPHERAL VASCULAR CATHETERIZATION N/A 11/15/2016   Procedure: Visceral Artery Intervention;   Surgeon: Katha Cabal, MD;  Location: [place] [facility];  Service: Cardiovascular;  Laterality: N/A;  . TONSILLECTOMY    . VISCERAL ANGIOGRAPHY N/A 02/12/2018   Procedure: VISCERAL ANGIOGRAPHY;  Surgeon: Katha Cabal, MD;  Location: [place] [facility];  Service: Cardiovascular;  Laterality: N/A;    Social History Social History   Tobacco Use  . Smoking status: Former Smoker    Types: E-cigarettes    Last attempt to quit: 2014    Years since quitting: 5.2  . Smokeless tobacco: Never Used  Substance Use Topics  . Alcohol use: Yes    Alcohol/week: 1.2 - 2.4 oz    Types: 2 - 4 Cans of beer per week  . Drug use: No    Family History Family History  Problem Relation Age of Onset  . Heart attack Mother   . Heart attack Father   . Basal cell carcinoma Father     Allergies  Allergen Reactions  . Eliquis [Apixaban] Other (See Comments)    causes nose bleeds  . Oxycodone-Acetaminophen Other (See Comments)    hallucinations  . Penicillins Rash and Other (See Comments)    Childhood allergy Has patient had a PCN reaction causing immediate rash, facial/tongue/throat swelling, SOB or lightheadedness with hypotension: No Has patient had a PCN reaction causing severe rash involving mucus membranes or skin necrosis: No Has patient had a PCN reaction that required hospitalization: No Has patient had a PCN reaction occurring within the last 10 years: No If all of the above answers are "NO", then may proceed with Cephalosporin use.      REVIEW OF SYSTEMS (Negative unless checked)  Constitutional: [] Weight loss  [] Fever  [] Chills Cardiac: [] Chest pain   [] Chest pressure   [] Palpitations   [] Shortness of breath when laying flat   [] Shortness of breath with exertion. Vascular:  [] Pain in legs with walking   [] Pain in legs at rest  [] History of DVT   [] Phlebitis   [] Swelling in legs   [] Varicose veins   [] Non-healing ulcers Pulmonary:   [] Uses home oxygen   [] Productive cough    [] Hemoptysis   [] Wheeze  [] COPD   [] Asthma Neurologic:  [] Dizziness   [] Seizures   [] History of stroke   [] History of TIA  [] Aphasia   [] Vissual changes   [] Weakness or numbness in arm   [] Weakness or numbness in leg Musculoskeletal:   [] Joint swelling   [] Joint pain   [] Low back pain Hematologic:  [] Easy bruising  [] Easy bleeding   [] Hypercoagulable state   [] Anemic Gastrointestinal:  [] Diarrhea   [] Vomiting  [] Gastroesophageal reflux/heartburn   [] Difficulty swallowing. Genitourinary:  [] Chronic kidney disease   [] Difficult urination  [] Frequent urination   [] Blood in urine Skin:  [] Rashes   [] Ulcers  Psychological:  [] History of anxiety   []  History of major depression.  Physical Examination  Vitals:   03/11/18 0844  BP: (!) 169/76  Pulse: 75  Resp: 16  Weight: 151 lb (68.5 kg)  Height: 5\' 7"  (1.702 m)   Body mass index is 23.65 kg/m. Gen: WD/WN, NAD Head: Icard/AT, No temporalis wasting.  Ear/Nose/Throat: Hearing grossly intact, nares w/o erythema or drainage Eyes: PER, EOMI, sclera nonicteric.  Neck: Supple, no large masses.   Pulmonary:  Good air movement, no audible wheezing bilaterally, no  use of accessory muscles.  Cardiac: RRR, no JVD Vascular:  Vessel Right Left  Radial Palpable Palpable  PT Trace Palpable Trace Palpable  DP Trace Palpable Trace Palpable  Gastrointestinal: Non-distended. No guarding/no peritoneal signs.  Musculoskeletal: M/S 5/5 throughout.  No deformity or atrophy.  Neurologic: CN 2-12 intact. Symmetrical.  Speech is fluent. Motor exam as listed above. Psychiatric: Judgment intact, Mood & affect appropriate for pt's clinical situation. Dermatologic: No rashes or ulcers noted.  No changes consistent with cellulitis. Lymph : No lichenification or skin changes of chronic lymphedema.  CBC Lab Results  Component Value Date   WBC 15.4 (H) 01/10/2018   HGB 10.8 (L) 01/10/2018   HCT 31.7 (L) 01/10/2018   MCV 101.0 (H) 01/10/2018   PLT 306 01/10/2018      BMET    Component Value Date/Time   NA 137 01/09/2018 0415   NA 137 03/22/2013 0428   K 3.8 01/09/2018 0415   K 3.8 03/22/2013 0428   CL 101 01/09/2018 0415   CL 102 03/22/2013 0428   CO2 25 01/09/2018 0415   CO2 32 03/22/2013 0428   GLUCOSE 203 (H) 01/09/2018 0415   GLUCOSE 99 03/22/2013 0428   BUN 11 02/12/2018 0729   BUN 15 03/22/2013 0428   CREATININE 0.75 02/12/2018 0729   CREATININE 1.00 03/22/2013 0428   CALCIUM 8.5 (L) 01/09/2018 0415   CALCIUM 8.4 (L) 03/22/2013 0428   GFRNONAA >60 02/12/2018 0729   GFRNONAA >60 03/22/2013 0428   GFRAA >60 02/12/2018 0729   GFRAA >60 03/22/2013 0428   CrCl cannot be calculated (Patient's most recent lab result is older than the maximum 21 days allowed.).  COAG Lab Results  Component Value Date   INR 2.26 02/12/2018   INR 1.36 01/10/2018   INR 1.40 01/09/2018    Radiology No results found.    Assessment/Plan  1. Mesenteric ischemia (HCC) Recommend:  The patient is status post successful angiogram with intervention of the mesenteric vessels.  Angioplasty  was performed.  The patient reports that the abdominal pain is improved and the post prandial symptoms are essentially gone.   The patient denies lifestyle limiting changes at this point in time.  No further invasive studies, angiography or surgery at this time The patient should continue walking and begin a more formal exercise program.  The patient should continue antiplatelet therapy and aggressive treatment of the lipid abnormalities  Smoking cessation was again discussed  Patient should undergo noninvasive studies as ordered. The patient will follow up with me after the studies.   - VAS [place] MESENTERIC; Future  2. PAD (peripheral artery disease) (HCC)  Recommend:  The patient has evidence of atherosclerosis of the lower extremities with claudication.  The patient does not voice lifestyle limiting changes at this point in time.  Noninvasive studies do not  suggest clinically significant change.  No invasive studies, angiography or surgery at this time The patient should continue walking and begin a more formal exercise program.  The patient should continue antiplatelet therapy and aggressive treatment of the lipid abnormalities  No changes in the patient's medications at this time  The patient should continue wearing graduated compression socks 10-15 mmHg strength to control the mild edema.   - VAS [place] ABI WITH/WO TBI; Future  3. Essential hypertension Continue antihypertensive medications as already ordered, these medications have been reviewed and there are no changes at this time.   4. Atrial flutter, unspecified type ([place]) Continue antiarrhythmia medications as already ordered, these  medications have been reviewed and there are no changes at this time.  Continue anticoagulation as ordered by Cardiology Service   5. Mixed hyperlipidemia Continue statin as ordered and reviewed, no changes at this time    Hortencia Pilar, MD  03/11/2018 8:59 AM

## 2018-03-13 ENCOUNTER — Encounter (INDEPENDENT_AMBULATORY_CARE_PROVIDER_SITE_OTHER): Payer: Medicare Other

## 2018-03-14 ENCOUNTER — Encounter (INDEPENDENT_AMBULATORY_CARE_PROVIDER_SITE_OTHER): Payer: Self-pay

## 2018-03-14 ENCOUNTER — Encounter (INDEPENDENT_AMBULATORY_CARE_PROVIDER_SITE_OTHER): Payer: Medicare Other

## 2018-03-14 ENCOUNTER — Encounter (INDEPENDENT_AMBULATORY_CARE_PROVIDER_SITE_OTHER): Payer: Medicare PPO

## 2018-03-14 ENCOUNTER — Ambulatory Visit (INDEPENDENT_AMBULATORY_CARE_PROVIDER_SITE_OTHER): Payer: Medicare Other | Admitting: Vascular Surgery

## 2018-04-22 IMAGING — CT CT ABD-PELV W/ CM
2 of 5 series · 15 of 46 positions shown, 17 images · IV contrast (APPLIED)
Comparison: CT of the abdomen and pelvis from 08/24/2016

CLINICAL DATA: Acute onset of generalized abdominal pain. Recent
abdominal vascular surgery. Initial encounter.

EXAM:
CT ABDOMEN AND PELVIS WITH CONTRAST
TECHNIQUE: Multidetector CT imaging of the abdomen and pelvis was performed
using the standard protocol following bolus administration of
intravenous contrast.
CONTRAST:  100mL [id number] IOPAMIDOL ([id number]) INJECTION 61%

[Series 2: axial st · axial · 0.77mm/px · z∈[-667,-307]mm · 12 of 80 slices shown, 14 images]
[im 4/80  soft-tissue]
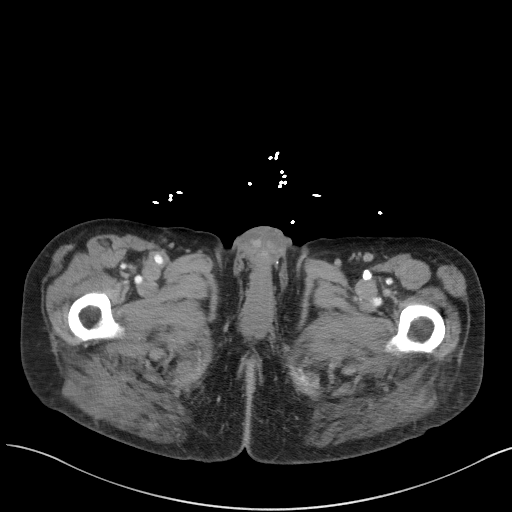
[im 4/80  bone]
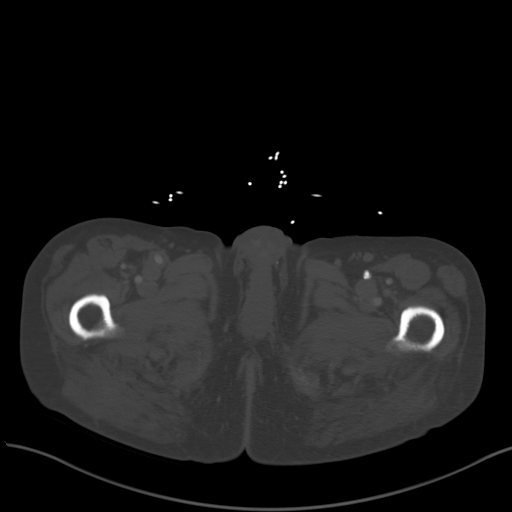
[im 12/80  soft-tissue]
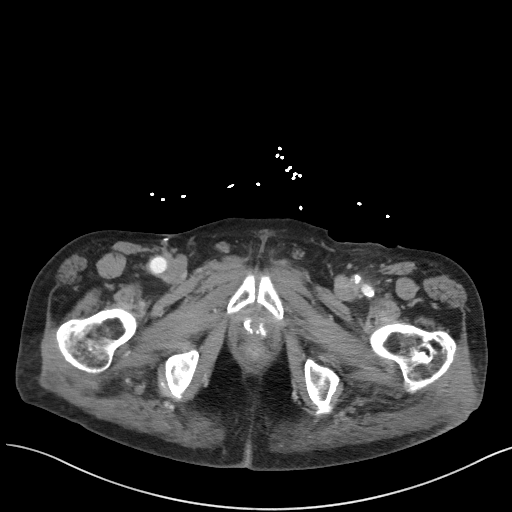
[im 16/80  soft-tissue]
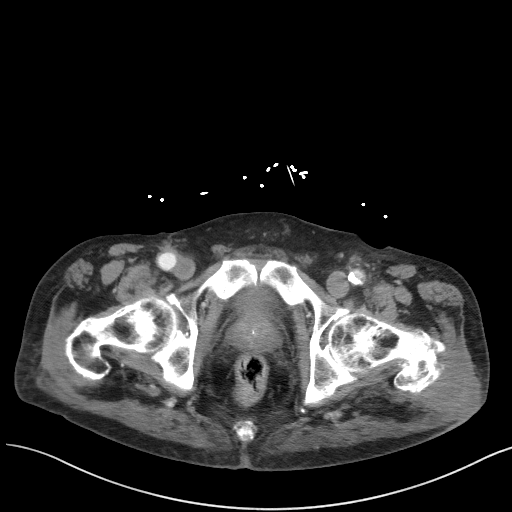
[im 24/80  soft-tissue]
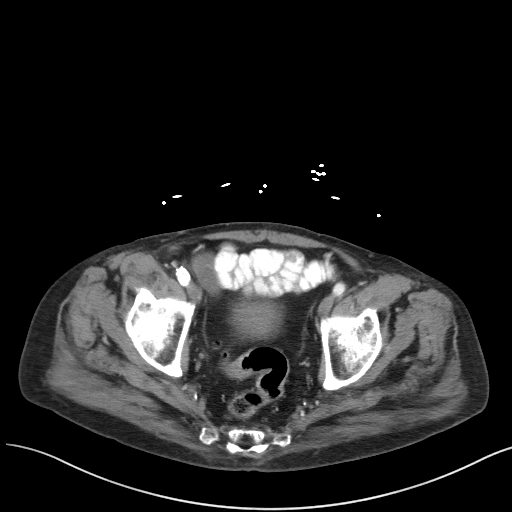
[im 32/80  soft-tissue]
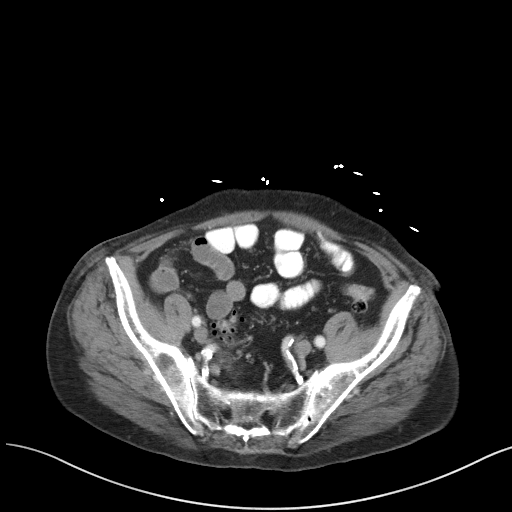
[im 36/80  soft-tissue]
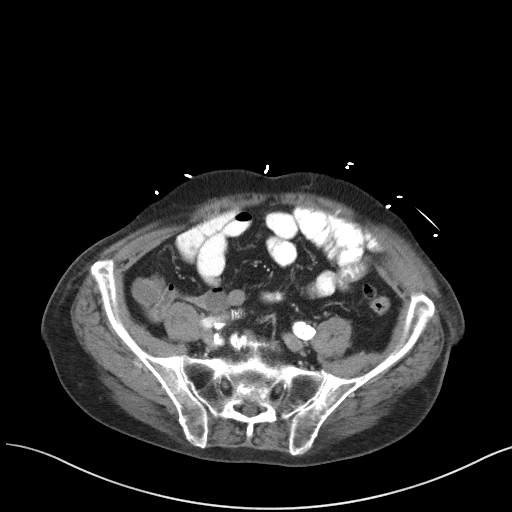
[im 44/80  soft-tissue]
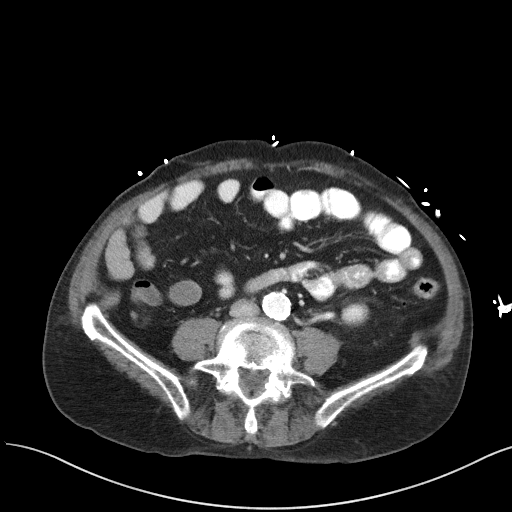
[im 48/80  soft-tissue]
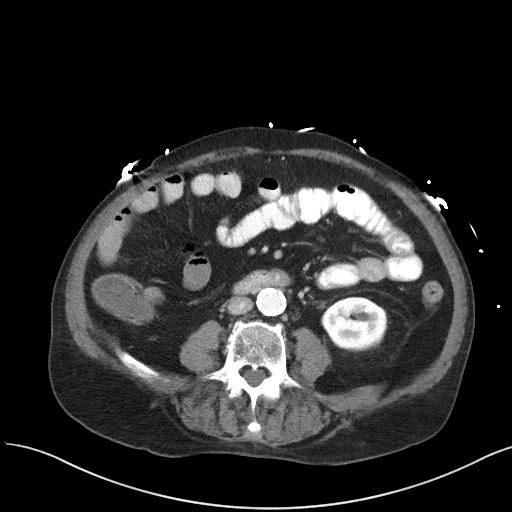
[im 56/80  soft-tissue]
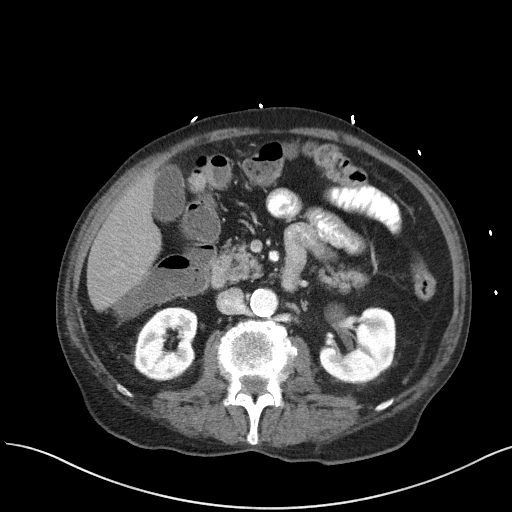
[im 56/80  bone]
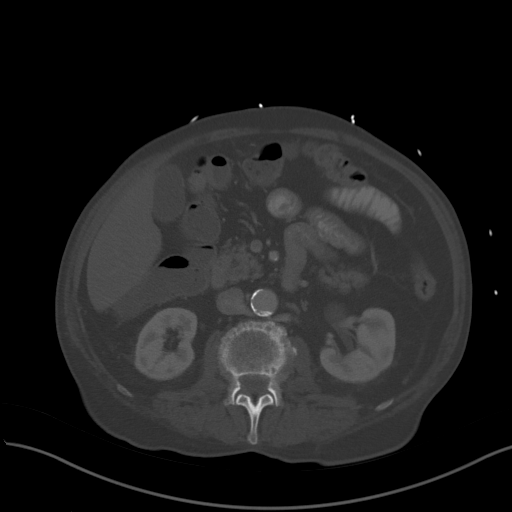
[im 64/80  soft-tissue]
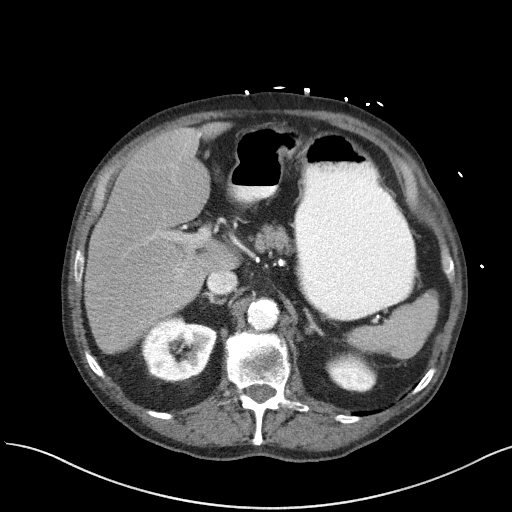
[im 68/80  soft-tissue]
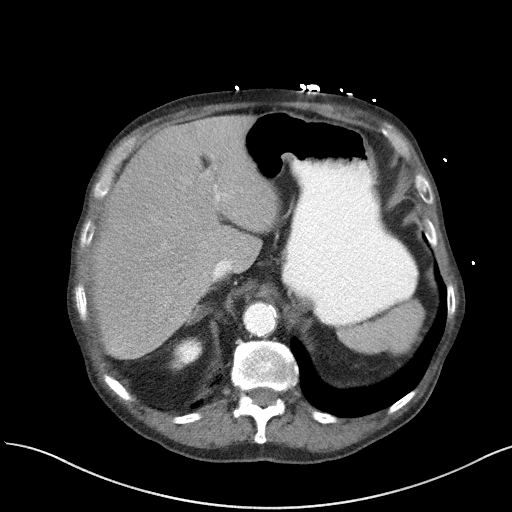
[im 76/80  soft-tissue]
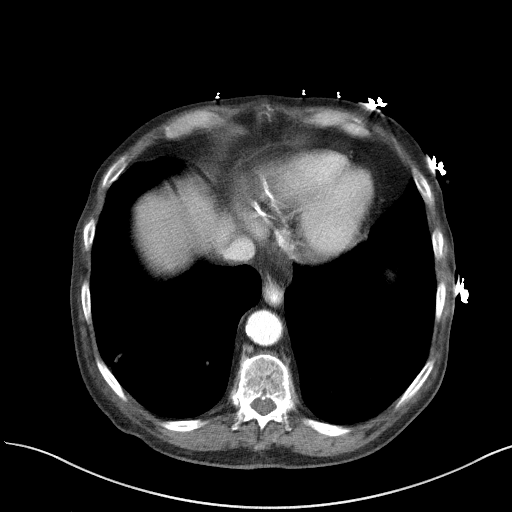

[Series 5: coronal st · coronal · 0.68mm/px · 3 of 95 slices shown]
[im 32/95  soft-tissue]
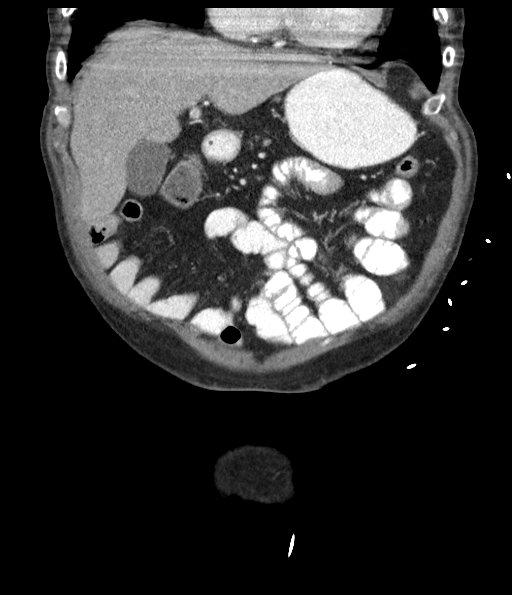
[im 42/95  soft-tissue]
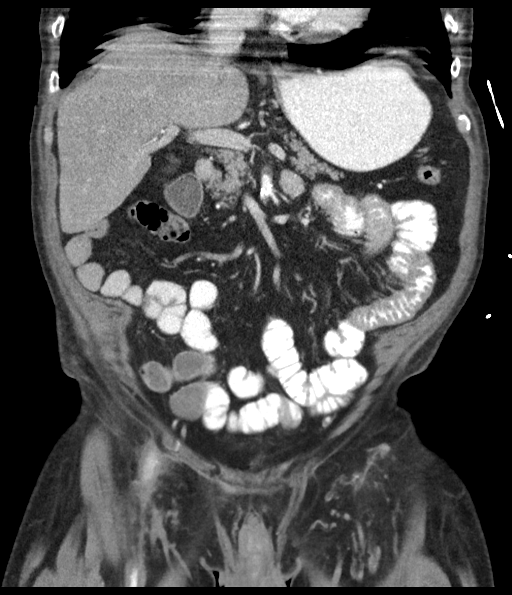
[im 53/95  soft-tissue]
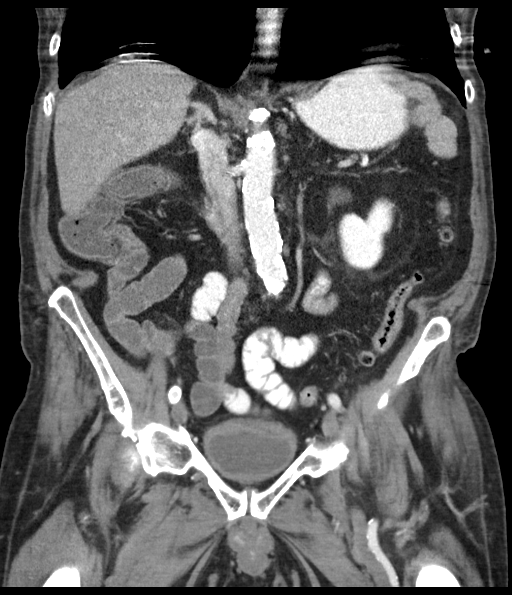

[15 of 46 positions shown; findings below may reference images not displayed]

FINDINGS: Lower chest: Mild scarring is noted at the right lung base.
Scattered coronary artery calcifications are seen.

Hepatobiliary: The liver is unremarkable in appearance. The
gallbladder is unremarkable in appearance. The common bile duct
remains normal in caliber.

Pancreas: The pancreas is within normal limits.

Spleen: The spleen is unremarkable in appearance.

Adrenals/Urinary Tract: The adrenal glands are unremarkable in
appearance. Mild bilateral renal scarring is noted. Mild perinephric
stranding is noted bilaterally. The kidneys are within normal
limits. There is no evidence of hydronephrosis. No renal or ureteral
stones are identified.

Stomach/Bowel: The stomach is partially filled with contrast and is
unremarkable in appearance.

The appendix is normal in caliber, without evidence of appendicitis.
There is mild wall thickening along the cecum and proximal ascending
colon, raising question for a mild infectious or inflammatory
process. The colon is largely decompressed. Scattered diverticulosis
is noted along the descending and sigmoid colon, without evidence of
diverticulitis.

Visualized small bowel loops are unremarkable in appearance.

Vascular/Lymphatic: Diffuse calcification is seen along the
abdominal aorta and its branches.

There is near complete occlusion of the superior mesenteric artery
at and distal to the patient's superior mesenteric artery stent
secondary to underlying mural thrombus, measuring approximately 4 cm
in length, with reconstitution noted distally.

Dense calcification is noted at the origins of the renal arteries
bilaterally.

The inferior vena cava is grossly unremarkable. No retroperitoneal
lymphadenopathy is seen. No pelvic sidewall lymphadenopathy is
identified.

Reproductive: The bladder is mildly distended and grossly
unremarkable. The prostate is normal in size, with scattered
calcification.

Other: No additional soft tissue abnormalities are seen.

Musculoskeletal: No acute osseous abnormalities are identified.
There is grade 1 anterolisthesis of L5 on S1, reflecting chronic
bilateral pars defects at L5. Vacuum phenomenon is noted along the
lumbar spine. There is mild chronic loss of height at vertebral body
T12. The visualized musculature is unremarkable in appearance.
IMPRESSION: 1. Mild wall thickening along the cecum and proximal ascending
colon, raising question for a mild infectious or inflammatory
process.
2. Near complete occlusion of the superior mesenteric artery at and
distal to the patient's superior mesenteric artery stent, secondary
to underlying mural thrombus, measuring approximately 4 cm in
length, with reconstitution noted distally. The degree of thrombosis
is more prominent than on the prior study.
3. Scattered diverticulosis along the descending and sigmoid colon,
without evidence of diverticulitis.
4. Diffuse aortic atherosclerosis. Dense calcification at the
origins of the renal arteries bilaterally.
5. Mild scarring at the right lung base.
6. Scattered coronary artery calcifications seen.
7. Mild bilateral renal scarring noted.
8. Grade 1 anterolisthesis of L5 on S1, reflecting chronic bilateral
pars defects at L5.
These results were called by telephone at the time of interpretation
on 11/14/2016 at [DATE] to Dr. HSSIN MASLAH, who verbally
acknowledged these results.

## 2018-05-18 DEATH — deceased

## 2019-03-13 ENCOUNTER — Ambulatory Visit (INDEPENDENT_AMBULATORY_CARE_PROVIDER_SITE_OTHER): Payer: Medicare PPO | Admitting: Vascular Surgery

## 2019-03-13 ENCOUNTER — Encounter (INDEPENDENT_AMBULATORY_CARE_PROVIDER_SITE_OTHER): Payer: Medicare PPO

## 2019-06-16 IMAGING — DX DG CHEST 1V PORT
1 series · 1 of 1 positions shown · non-contrast
Comparison: 01/08/2018

CLINICAL DATA: Acute respiratory failure

EXAM:
PORTABLE CHEST 1 VIEW

[chest ap]
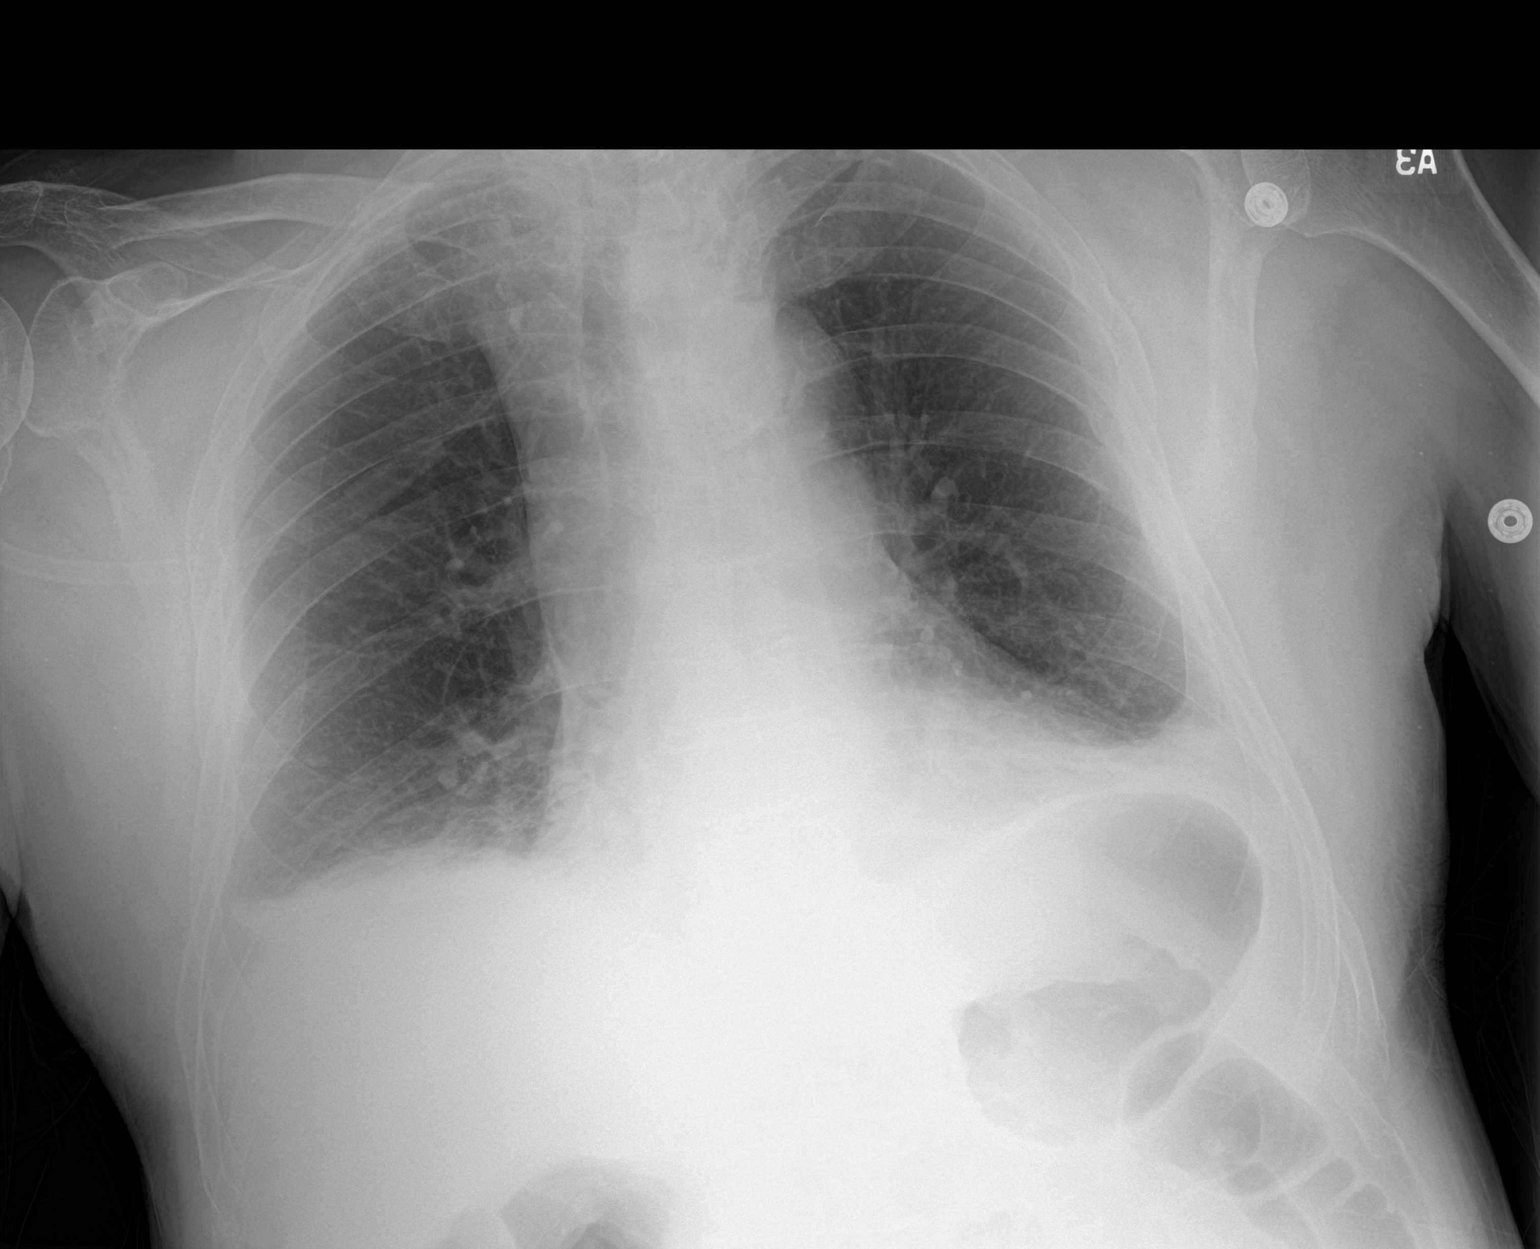

[1 of 1 positions shown; findings below may reference images not displayed]

FINDINGS: Cardiac shadow is stable. Aortic calcifications are again seen.
Bibasilar atelectatic changes are noted increased from the prior
exam. Old rib fractures with healing on the left are seen.
IMPRESSION: Bibasilar atelectatic changes increased from the prior exam.
